# Patient Record
Sex: Female | Born: 1960
Health system: Southern US, Community
[De-identification: ages and names within clinical notes are randomized; demographics above are authoritative.]

## PROBLEM LIST (undated history)

## (undated) DIAGNOSIS — I10 Essential (primary) hypertension: Secondary | ICD-10-CM

## (undated) DIAGNOSIS — D241 Benign neoplasm of right breast: Secondary | ICD-10-CM

## (undated) HISTORY — PX: BREAST BIOPSY: SHX20

## (undated) HISTORY — DX: Benign neoplasm of right breast: D24.1

## (undated) HISTORY — DX: Essential (primary) hypertension: I10

---
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
## 1985-01-25 HISTORY — PX: LEG SURGERY: SHX1003

## 2005-01-25 HISTORY — PX: SMALL INTESTINE SURGERY: SHX150

## 2015-07-03 ENCOUNTER — Encounter: Payer: Self-pay | Admitting: Osteopathic Medicine

## 2015-07-03 ENCOUNTER — Ambulatory Visit (INDEPENDENT_AMBULATORY_CARE_PROVIDER_SITE_OTHER): Payer: BLUE CROSS/BLUE SHIELD | Admitting: Osteopathic Medicine

## 2015-07-03 VITALS — BP 137/87 | HR 83 | Ht 70.0 in | Wt 173.0 lb

## 2015-07-03 DIAGNOSIS — Z780 Asymptomatic menopausal state: Secondary | ICD-10-CM

## 2015-07-03 DIAGNOSIS — I10 Essential (primary) hypertension: Secondary | ICD-10-CM | POA: Diagnosis not present

## 2015-07-03 DIAGNOSIS — Z720 Tobacco use: Secondary | ICD-10-CM

## 2015-07-03 LAB — COMPLETE METABOLIC PANEL WITH GFR
ALBUMIN: 4.4 g/dL (ref 3.6–5.1)
ALK PHOS: 90 U/L (ref 33–130)
ALT: 10 U/L (ref 6–29)
AST: 18 U/L (ref 10–35)
BILIRUBIN TOTAL: 0.4 mg/dL (ref 0.2–1.2)
BUN: 17 mg/dL (ref 7–25)
CALCIUM: 10.1 mg/dL (ref 8.6–10.4)
CO2: 25 mmol/L (ref 20–31)
CREATININE: 0.8 mg/dL (ref 0.50–1.05)
Chloride: 102 mmol/L (ref 98–110)
GFR, Est Non African American: 84 mL/min (ref >=60)
Glucose, Bld: 84 mg/dL (ref 65–99)
Potassium: 4.4 mmol/L (ref 3.5–5.3)
Sodium: 139 mmol/L (ref 135–146)
TOTAL PROTEIN: 7.3 g/dL (ref 6.1–8.1)

## 2015-07-03 LAB — LIPID PANEL
Cholesterol: 225 mg/dL — ABNORMAL HIGH (ref 125–200)
HDL: 73 mg/dL (ref >=46)
LDL CALC: 139 mg/dL — AB (ref <130)
TRIGLYCERIDES: 66 mg/dL (ref <150)
Total CHOL/HDL Ratio: 3.1 Ratio (ref <=5.0)
VLDL: 13 mg/dL (ref <30)

## 2015-07-03 LAB — CBC WITH DIFFERENTIAL/PLATELET
BASOS ABS: 0 {cells}/uL (ref 0–200)
BASOS PCT: 0 %
EOS ABS: 152 {cells}/uL (ref 15–500)
Eosinophils Relative: 2 %
HEMATOCRIT: 38 % (ref 35.0–45.0)
Hemoglobin: 12.3 g/dL (ref 11.7–15.5)
LYMPHS PCT: 40 %
Lymphs Abs: 3040 cells/uL (ref 850–3900)
MCH: 30 pg (ref 27.0–33.0)
MCHC: 32.4 g/dL (ref 32.0–36.0)
MCV: 92.7 fL (ref 80.0–100.0)
MONO ABS: 304 {cells}/uL (ref 200–950)
MPV: 9.9 fL (ref 7.5–12.5)
Monocytes Relative: 4 %
NEUTROS ABS: 4104 {cells}/uL (ref 1500–7800)
Neutrophils Relative %: 54 %
Platelets: 292 10*3/uL (ref 140–400)
RBC: 4.1 MIL/uL (ref 3.80–5.10)
RDW: 13.3 % (ref 11.0–15.0)
WBC: 7.6 10*3/uL (ref 3.8–10.8)

## 2015-07-03 LAB — TSH: TSH: 1.11 mIU/L

## 2015-07-03 MED ORDER — HYDROCHLOROTHIAZIDE 25 MG PO TABS: 25.0000 mg | ORAL_TABLET | Freq: Every day | ORAL | Status: DC

## 2015-07-03 MED ORDER — LOSARTAN POTASSIUM 50 MG PO TABS: 50.0000 mg | ORAL_TABLET | Freq: Every day | ORAL | Status: DC

## 2015-07-03 NOTE — Progress Notes (Signed)
HPI: Jenna Hess is a 55 y.o. female who presents to [facility] today for chief complaint of:  Chief Complaint  Patient presents with  . Establish Care    blood pressure    HTN . Context: new to establish care - needs refill on BP meds . Duration: Dx HTN x 4 years ago.  . Assoc signs/symptoms: no CP/SOB, no ha/vc  OTHER  Recent biometric screening at work 03/2015. Not sure we can get the results.   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 No pertinent past medical history. History reviewed. No pertinent past surgical history. Social History  Substance Use Topics  . Smoking status: Current Every Day Smoker  . Smokeless tobacco: Not on file  . Alcohol Use: Not on file   History reviewed. No pertinent family history.  Current Outpatient Prescriptions  Medication Sig Dispense Refill  . hydrochlorothiazide (HYDRODIURIL) 25 MG tablet Take 25 mg by mouth daily.    Marland Kitchen losartan (COZAAR) 50 MG tablet Take 50 mg by mouth daily.     No current facility-administered medications for this visit.   Allergies  Allergen Reactions  . Coconut Oil   . Codeine   . Morphine And Related       Review of Systems: CONSTITUTIONAL:  No  fever, no chills, No recent illness, No unintentional weight changes HEAD/EYES/EARS/NOSE/THROAT: No  headache, no vision change, no hearing change, No sore throat, No  sinus pressure CARDIAC: No  chest pain, No  pressure, No palpitations, No  orthopnea RESPIRATORY: No  cough, No  shortness of breath/wheeze GASTROINTESTINAL: No  nausea, No  vomiting, No  abdominal pain, No  blood in stool, No  diarrhea, No  constipation  MUSCULOSKELETAL: No  myalgia/arthralgia GENITOURINARY: No  incontinence, No  abnormal genital bleeding/discharge SKIN: No  rash/wounds/concerning lesions HEM/ONC: No  easy bruising/bleeding, No   abnormal lymph node ENDOCRINE: No polyuria/polydipsia/polyphagia, No  heat/cold intolerance  NEUROLOGIC: No  weakness, No  dizziness, No  slurred speech PSYCHIATRIC: No  concerns with depression, No  concerns with anxiety, No sleep problems  Exam:  BP 137/87 mmHg  Pulse 83  Ht 5\' 10"  (1.778 m)  Wt 173 lb (78.472 kg)  BMI 24.82 kg/m2 Constitutional: VS see above. General Appearance: alert, well-developed, well-nourished, NAD Eyes: Normal lids and conjunctive, non-icteric sclera, PERRLA Ears, Nose, Mouth, Throat: MMM, Normal external inspection ears/nares/mouth/lips/gums, TM normal bilaterally. Pharynx/tonsils no erythema, no exudate. Nasal mucosa normal.  Neck: No masses, trachea midline. No thyroid enlargement. No tenderness/mass appreciated. No lymphadenopathy Respiratory: Normal respiratory effort. no wheeze, no rhonchi, no rales Cardiovascular: S1/S2 normal, no murmur, no rub/gallop auscultated. RRR. No lower extremity edema. Gastrointestinal: Nontender, no masses. No hepatomegaly, no splenomegaly. No hernia appreciated. Bowel sounds normal. Rectal exam deferred. (+) scar tissue Musculoskeletal: Gait normal. No clubbing/cyanosis of digits.  Neurological: No cranial nerve deficit on limited exam. Motor and sensation intact and symmetric Skin: warm, dry, intact. No rash/ulcer. No concerning nevi or subq nodules on limited exam.   Psychiatric: Normal judgment/insight. Normal mood and affect. Oriented x3.    No results found for this or any previous visit (from the past 72 hour(s)).  No results found.   ASSESSMENT/PLAN:   Essential hypertension - Plan: hydrochlorothiazide (HYDRODIURIL) 25 MG tablet, losartan (COZAAR) 50 MG tablet, CBC with Differential/Platelet, COMPLETE METABOLIC PANEL WITH GFR, Lipid panel, TSH  Tobacco use  Postmenopausal -  Plan: VITAMIN D 25 Hydroxy (Vit-D Deficiency, Fractures)   All questions were answered. Visit summary with medication list and pertinent  instructions was printed for patient to review. ER/RTC precautions were reviewed with the patient. Return in about 6 months (around 01/02/2016) for [place] W/ PAP.

## 2015-07-04 LAB — VITAMIN D 25 HYDROXY (VIT D DEFICIENCY, FRACTURES): Vit D, 25-Hydroxy: 23 ng/mL — ABNORMAL LOW (ref 30–100)

## 2015-12-21 ENCOUNTER — Other Ambulatory Visit: Payer: Self-pay | Admitting: Osteopathic Medicine

## 2015-12-21 DIAGNOSIS — I10 Essential (primary) hypertension: Secondary | ICD-10-CM

## 2016-01-02 ENCOUNTER — Encounter: Payer: BLUE CROSS/BLUE SHIELD | Admitting: Osteopathic Medicine

## 2016-01-08 ENCOUNTER — Ambulatory Visit (INDEPENDENT_AMBULATORY_CARE_PROVIDER_SITE_OTHER): Payer: BLUE CROSS/BLUE SHIELD | Admitting: Osteopathic Medicine

## 2016-01-08 ENCOUNTER — Encounter: Payer: Self-pay | Admitting: Osteopathic Medicine

## 2016-01-08 VITALS — BP 135/87 | HR 92 | Ht 70.0 in | Wt 158.0 lb

## 2016-01-08 DIAGNOSIS — I10 Essential (primary) hypertension: Secondary | ICD-10-CM | POA: Diagnosis not present

## 2016-01-08 DIAGNOSIS — Z0000 Encounter for general adult medical examination without abnormal findings: Secondary | ICD-10-CM | POA: Diagnosis not present

## 2016-01-08 MED ORDER — LOSARTAN POTASSIUM 50 MG PO TABS: 50.0000 mg | ORAL_TABLET | Freq: Every day | ORAL | 11 refills | Status: DC

## 2016-01-08 MED ORDER — HYDROCHLOROTHIAZIDE 25 MG PO TABS: 25.0000 mg | ORAL_TABLET | Freq: Every day | ORAL | 11 refills | Status: DC

## 2016-01-08 NOTE — Progress Notes (Signed)
HPI: Jenna Hess is a 55 y.o. female  who presents to [facility] today, 01/08/16,  for chief complaint of:  Chief Complaint  Patient presents with  . Annual Exam   Patient here today for annual physical/blood pressure recheck. See below for review of preventive care. No complaints today.   Past medical, surgical, social and family history reviewed: Patient Active Problem List   Diagnosis Date Noted  . Essential hypertension 07/03/2015  . Tobacco use 07/03/2015  . Postmenopausal 07/03/2015   Past Surgical History:  Procedure Laterality Date  . APPENDECTOMY  1976  . LEG SURGERY  1987  . SMALL INTESTINE SURGERY  2007   small and large   Social History  Substance Use Topics  . Smoking status: Current Every Day Smoker  . Smokeless tobacco: Never Used  . Alcohol use Not on file   History reviewed. No pertinent family history.   Current medication list and allergy/intolerance information reviewed:   Current Outpatient Prescriptions  Medication Sig Dispense Refill  . hydrochlorothiazide (HYDRODIURIL) 25 MG tablet TAKE 1 TABLET (25 MG TOTAL) BY MOUTH DAILY. 90 tablet 1  . losartan (COZAAR) 50 MG tablet TAKE 1 TABLET (50 MG TOTAL) BY MOUTH DAILY. 90 tablet 1   No current facility-administered medications for this visit.    Allergies  Allergen Reactions  . Coconut Oil   . Codeine   . Morphine And Related       Review of Systems:  Constitutional:  No  fever, no chills, No recent illness   HEENT: No  headache, no vision change  Cardiac: No  chest pain, No  pressur  Respiratory:  No  shortness of breath. No  Cough  Gastrointestinal: No  abdominal pain, No  nausea, No  vomiting,  No  blood in stool, No  diarrhea, No  constipation   Musculoskeletal: No new myalgia/arthralgia  Genitourinary:No  abnormal genital bleeding, No abnormal genital discharge  Skin: No  Rash, No other wounds/concerning lesions  Neurologic: No  weakness,  No  dizziness  Psychiatric: No  concerns with depression, No  concerns with anxiety, No sleep problems, No mood problems  Exam:  BP 135/87   Pulse 92   Ht 5\' 10"  (1.778 m)   Wt 158 lb (71.7 kg)   BMI 22.67 kg/m   Constitutional: VS see above. General Appearance: alert, well-developed, well-nourished, NAD  Eyes: Normal lids and conjunctive, non-icteric sclera  Ears, Nose, Mouth, Throat: MMM, Normal external inspection ears/nares/mouth/lips/gums. TM normal bilaterally. Pharynx/tonsils no erythema, no exudate. Nasal mucosa normal.   Neck: No masses, trachea midline. No thyroid enlargement. No tenderness/mass appreciated. No lymphadenopathy  Respiratory: Normal respiratory effort. no wheeze, no rhonchi, no rales  Cardiovascular: S1/S2 normal, no murmur, no rub/gallop auscultated. RRR. No lower extremity edema.   Gastrointestinal: Nontender, no masses. No hepatomegaly, no splenomegaly. No hernia appreciated. Bowel sounds normal. Rectal exam deferred.   Musculoskeletal: Gait normal. No clubbing/cyanosis of digits.   Neurological: Normal balance/coordination. No tremor. No cranial nerve deficit on limited exam. Motor and sensation intact and symmetric. Cerebellar reflexes intact.   Skin: warm, dry, intact. No rash/ulcer. No concerning nevi or subq nodules on limited exam.    Psychiatric: Normal judgment/insight. Normal mood and affect. Oriented x3.      ASSESSMENT/PLAN:   Annual physical exam - Plan: Cologuard, MM DIGITAL SCREENING BILATERAL  Essential hypertension - Stable on Losartan, HCTZ - lipid and DM screening 07/03/15 - Plan: hydrochlorothiazide (HYDRODIURIL) 25 MG tablet, losartan (COZAAR)  50 MG tablet    FEMALE PREVENTIVE CARE Updated 01/08/16   ANNUAL SCREENING/COUNSELING  Diet/Exercise - HEALTHY HABITS DISCUSSED TO DECREASE CV RISK History  Smoking Status  . Current Every Day Smoker  Smokeless Tobacco  . Never Used   History  Alcohol use Not on file    Depression screen [facility] 2/9 01/08/2016  Decreased Interest 0  Down, Depressed, Hopeless 0  PHQ - 2 Score 0    Domestic violence concerns - no  HTN SCREENING - SEE [place]  Sexually active in the past year - Yes with female.  Need/want STI testing today? - no  Concerns about libido or pain with sex? - some, longstanding, not concerned  Plans for pregnancy? - menopause  INFECTIOUS DISEASE SCREENING  HIV - does not need  GC/CT - does not need  HepC - DOB [birth year] - needs - declined  TB - does not need  DISEASE SCREENING  Lipid - does not need - normal 6 mos ago  DM2 - does not need - normal 6 mos ago  Osteoporosis - women age 62+ - does not need  CANCER SCREENING  Cervical - needs - no history of abnormal, declined today  Breast - needs  Lung - does not need  Colon - needs - opts for cologuard  ADULT VACCINATION  Influenza - annual vaccine recommended  Td - booster every 10 years   Zoster - option at 64, yes at 60+   PCV13 - was not indicated  PPSV23 - was not indicated Immunization History  Administered Date(s) Administered  . Hepatitis B 01/07/2016   OTHER  Fall - exercise and Vit D age 84+ - does not need  Consider ASA - age 68-59 - does not need   Labs normal last time,, weight loss successful and BP good control, ok to repeat labs in 6-12 mos  Visit summary with medication list and pertinent instructions was printed for patient to review. All questions at time of visit were answered - patient instructed to contact office with any additional concerns. ER/RTC precautions were reviewed with the patient. Follow-up plan: Return in about 1 year (around 01/07/2017) for annual physical and pap, sooner if needed.

## 2016-02-03 ENCOUNTER — Ambulatory Visit (INDEPENDENT_AMBULATORY_CARE_PROVIDER_SITE_OTHER): Payer: BLUE CROSS/BLUE SHIELD | Admitting: Osteopathic Medicine

## 2016-02-03 ENCOUNTER — Ambulatory Visit (INDEPENDENT_AMBULATORY_CARE_PROVIDER_SITE_OTHER): Payer: BLUE CROSS/BLUE SHIELD

## 2016-02-03 ENCOUNTER — Encounter: Payer: Self-pay | Admitting: Osteopathic Medicine

## 2016-02-03 VITALS — BP 114/81 | HR 98 | Temp 98.2°F | Ht 70.0 in | Wt 157.0 lb

## 2016-02-03 DIAGNOSIS — J069 Acute upper respiratory infection, unspecified: Secondary | ICD-10-CM | POA: Diagnosis not present

## 2016-02-03 DIAGNOSIS — J029 Acute pharyngitis, unspecified: Secondary | ICD-10-CM

## 2016-02-03 DIAGNOSIS — B9789 Other viral agents as the cause of diseases classified elsewhere: Secondary | ICD-10-CM

## 2016-02-03 DIAGNOSIS — R928 Other abnormal and inconclusive findings on diagnostic imaging of breast: Secondary | ICD-10-CM

## 2016-02-03 DIAGNOSIS — B001 Herpesviral vesicular dermatitis: Secondary | ICD-10-CM | POA: Diagnosis not present

## 2016-02-03 DIAGNOSIS — Z1231 Encounter for screening mammogram for malignant neoplasm of breast: Secondary | ICD-10-CM | POA: Diagnosis not present

## 2016-02-03 LAB — POCT RAPID STREP A (OFFICE): RAPID STREP A SCREEN: NEGATIVE

## 2016-02-03 MED ORDER — LIDOCAINE VISCOUS HCL 2 % MT SOLN: 10.0000 mL | OROMUCOSAL | 0 refills | Status: DC | PRN

## 2016-02-03 MED ORDER — IPRATROPIUM BROMIDE 0.03 % NA SOLN
2.0000 | Freq: Three times a day (TID) | NASAL | 0 refills | Status: DC
Start: 2016-02-03 — End: 2016-10-25

## 2016-02-03 MED ORDER — BENZONATATE 200 MG PO CAPS: 200.0000 mg | ORAL_CAPSULE | Freq: Three times a day (TID) | ORAL | 0 refills | Status: DC | PRN

## 2016-02-03 MED ORDER — VALACYCLOVIR HCL 1 G PO TABS: 2000.0000 mg | ORAL_TABLET | Freq: Two times a day (BID) | ORAL | 2 refills | Status: DC

## 2016-02-03 NOTE — Patient Instructions (Signed)
Note: the following list assumes no pregnancy, normal liver & kidney function and no other drug interactions. Dr. Sheppard Coil has highlighted medications which are safe for you to use, but these may not be appropriate for everyone. Always ask a pharmacist or qualified medical provider if there are any questions!    Aches/Pains, Fever Acetaminophen (Tylenol) 500 mg tablets - take max 2 tablets (1000 mg) every 6 hours (4 times per day)  Ibuprofen (Motrin) 200 mg tablets - take max 4 tablets (800 mg) every 6 hours  Sinus Congestion Prescription Atrovent Cromolyn Nasal Spray (NasalCrom) 1 spray each nostril 3-4 times per day, max 6 imes per day Nasal Saline if desired Oxymetolazone (Afrin, others) sparing use due to rebound congestion - never use in young kids!  Phenylephrine (Sudafed) 10 mg tablets every 4 hours (or the 12-hour formulation) Diphenhydramine (Benadryl) 25 mg tablets - take max 2 tablets every 4 hours  Cough & Sore Throat Prescription cough pills or syrups Dextromethorphan (Robitussin, others) - cough suppressant Guaifenesin (Robitussin, Mucinex, others) - expectorant (helps cough up mucus) (Dextromethorphan and Guaifenesin also come in a combination tablet) Lozenges w/ Benzocaine + Menthol (Cepacol) Honey - as much as you want! Teas which "coat the throat" - look for ingredients Elm Bark, Licorice Root, Marshmallow Root  Other Zinc Lozenges within 24 hours of symptoms onset - mixed evidence this shortens the duration of the common cold Don't waste your money on Vitamin C or Echinacea

## 2016-02-03 NOTE — Progress Notes (Signed)
HPI: Jenna Hess is a 57 y.o. female who presents to [facility] 02/03/16 for chief complaint of:  Chief Complaint  Patient presents with  . Sore Throat    Acute Illness: . Quality:  Sick on and off x2 weeks, Initial flulike illness seemed to resolve but now is having some issues with significant sore throat, crusty eyes in the morning, dry cough for 3-4 days or so . Assoc signs/symptoms: see ROS - cold sore also . Duration: 3-4 days . Modifying factors: has tried the following OTC/Rx medications: none   Past medical, social and family history reviewed. Current medications and allergies reviewed.     Review of Systems:  Constitutional: No  fever/chills  HEENT: No  headache, Yes  sore throat, No  swollen glands  Cardiovascular: No chest pain  Respiratory:Yes  cough, No  shortness of breath  Gastrointestinal: No  nausea, No  vomiting,  No  diarrhea  Musculoskeletal:   No  myalgia/arthralgia  Skin/Integument:  Yes cold sore rash   Exam:  BP 114/81   Pulse 98   Temp 98.2 F (36.8 C) (Oral)   Ht 5\' 10"  (1.778 m)   Wt 157 lb (71.2 kg)   BMI 22.53 kg/m   Constitutional: VSS, see above. General Appearance: alert, well-developed, well-nourished, NAD  Eyes: Normal lids and conjunctive, non-icteric sclera, PERRLA  Ears, Nose, Mouth, Throat: Normal external inspection ears/nares/mouth/lips/gums, normal TM, MMM; posterior pharynx with erythema, without exudate, nasal mucosa normal  Neck: No masses, trachea midline. normal lymph nodes  Respiratory: Normal respiratory effort. No  wheeze/rhonchi/rales  Cardiovascular: S1/S2 normal, no murmur/rub/gallop auscultated. RRR.    ASSESSMENT/PLAN:  Viral URI with cough - Plan: ipratropium (ATROVENT) 0.03 % nasal spray, benzonatate (TESSALON) 200 MG capsule  Sore throat - Plan: POCT rapid strep A, Lidocaine HCl 2 % SOLN, DISCONTINUED: Lidocaine HCl 2 % SOLN, DISCONTINUED: Lidocaine HCl 2 %  SOLN  Viral pharyngitis - Plan: ipratropium (ATROVENT) 0.03 % nasal spray, Lidocaine HCl 2 % SOLN, DISCONTINUED: Lidocaine HCl 2 % SOLN, DISCONTINUED: Lidocaine HCl 2 % SOLN  Cold sore - Plan: valACYclovir (VALTREX) 1000 MG tablet     Patient Instructions  Note: the following list assumes no pregnancy, normal liver & kidney function and no other drug interactions. Dr. Sheppard Coil has highlighted medications which are safe for you to use, but these may not be appropriate for everyone. Always ask a pharmacist or qualified medical provider if there are any questions!    Aches/Pains, Fever Acetaminophen (Tylenol) 500 mg tablets - take max 2 tablets (1000 mg) every 6 hours (4 times per day)  Ibuprofen (Motrin) 200 mg tablets - take max 4 tablets (800 mg) every 6 hours  Sinus Congestion Prescription Atrovent Cromolyn Nasal Spray (NasalCrom) 1 spray each nostril 3-4 times per day, max 6 imes per day Nasal Saline if desired Oxymetolazone (Afrin, others) sparing use due to rebound congestion - never use in young kids!  Phenylephrine (Sudafed) 10 mg tablets every 4 hours (or the 12-hour formulation) Diphenhydramine (Benadryl) 25 mg tablets - take max 2 tablets every 4 hours  Cough & Sore Throat Prescription cough pills or syrups Dextromethorphan (Robitussin, others) - cough suppressant Guaifenesin (Robitussin, Mucinex, others) - expectorant (helps cough up mucus) (Dextromethorphan and Guaifenesin also come in a combination tablet) Lozenges w/ Benzocaine + Menthol (Cepacol) Honey - as much as you want! Teas which "coat the throat" - look for ingredients Elm Bark, Licorice Root, Marshmallow Root  Other Zinc Lozenges within  24 hours of symptoms onset - mixed evidence this shortens the duration of the common cold Don't waste your money on Vitamin C or Echinacea     Highlighted cough medications, dextromethorphan probably would be more helpful, can take with Tessalon, okay to use lozenges,  Honey, teas as noted above.    Visit summary was printed for the patient with medications and pertinent instructions for patient to review. ER/RTC precautions reviewed. All questions answered. Return if symptoms worsen or fail to improve.

## 2016-02-04 ENCOUNTER — Ambulatory Visit: Payer: BLUE CROSS/BLUE SHIELD

## 2016-02-04 ENCOUNTER — Other Ambulatory Visit: Payer: Self-pay | Admitting: Osteopathic Medicine

## 2016-02-04 DIAGNOSIS — R928 Other abnormal and inconclusive findings on diagnostic imaging of breast: Secondary | ICD-10-CM

## 2016-02-10 ENCOUNTER — Other Ambulatory Visit: Payer: Self-pay | Admitting: Osteopathic Medicine

## 2016-02-10 ENCOUNTER — Ambulatory Visit
Admission: RE | Admit: 2016-02-10 | Discharge: 2016-02-10 | Disposition: A | Discharge status: Home / Self Care | Payer: BLUE CROSS/BLUE SHIELD | Source: Ambulatory Visit | Attending: Osteopathic Medicine | Admitting: Osteopathic Medicine

## 2016-02-10 DIAGNOSIS — R928 Other abnormal and inconclusive findings on diagnostic imaging of breast: Secondary | ICD-10-CM

## 2016-02-10 DIAGNOSIS — R921 Mammographic calcification found on diagnostic imaging of breast: Secondary | ICD-10-CM | POA: Diagnosis not present

## 2016-02-10 DIAGNOSIS — N6489 Other specified disorders of breast: Secondary | ICD-10-CM | POA: Diagnosis not present

## 2016-02-13 ENCOUNTER — Inpatient Hospital Stay: Admission: RE | Admit: 2016-02-13 | Payer: BLUE CROSS/BLUE SHIELD | Source: Ambulatory Visit

## 2016-02-16 ENCOUNTER — Other Ambulatory Visit: Payer: Self-pay | Admitting: Osteopathic Medicine

## 2016-02-16 ENCOUNTER — Ambulatory Visit
Admission: RE | Admit: 2016-02-16 | Discharge: 2016-02-16 | Disposition: A | Discharge status: Home / Self Care | Payer: BLUE CROSS/BLUE SHIELD | Source: Ambulatory Visit | Attending: Osteopathic Medicine | Admitting: Osteopathic Medicine

## 2016-02-16 DIAGNOSIS — R928 Other abnormal and inconclusive findings on diagnostic imaging of breast: Secondary | ICD-10-CM

## 2016-02-16 DIAGNOSIS — I10 Essential (primary) hypertension: Secondary | ICD-10-CM

## 2016-02-16 DIAGNOSIS — D241 Benign neoplasm of right breast: Secondary | ICD-10-CM | POA: Diagnosis not present

## 2016-02-16 DIAGNOSIS — R921 Mammographic calcification found on diagnostic imaging of breast: Secondary | ICD-10-CM | POA: Diagnosis not present

## 2016-02-19 DIAGNOSIS — J0100 Acute maxillary sinusitis, unspecified: Secondary | ICD-10-CM | POA: Diagnosis not present

## 2016-02-19 DIAGNOSIS — J40 Bronchitis, not specified as acute or chronic: Secondary | ICD-10-CM | POA: Diagnosis not present

## 2016-03-21 ENCOUNTER — Encounter: Payer: Self-pay | Admitting: Osteopathic Medicine

## 2016-04-23 ENCOUNTER — Other Ambulatory Visit: Payer: Self-pay | Admitting: Osteopathic Medicine

## 2016-04-23 DIAGNOSIS — I10 Essential (primary) hypertension: Secondary | ICD-10-CM

## 2016-07-22 ENCOUNTER — Other Ambulatory Visit: Payer: Self-pay | Admitting: Osteopathic Medicine

## 2016-07-22 DIAGNOSIS — I10 Essential (primary) hypertension: Secondary | ICD-10-CM

## 2016-10-22 ENCOUNTER — Other Ambulatory Visit: Payer: Self-pay

## 2016-10-22 DIAGNOSIS — B001 Herpesviral vesicular dermatitis: Secondary | ICD-10-CM

## 2016-10-22 MED ORDER — VALACYCLOVIR HCL 1 G PO TABS: 2000.0000 mg | ORAL_TABLET | Freq: Two times a day (BID) | ORAL | 2 refills | Status: DC

## 2016-10-25 ENCOUNTER — Encounter: Payer: Self-pay | Admitting: Osteopathic Medicine

## 2016-10-25 ENCOUNTER — Other Ambulatory Visit (HOSPITAL_COMMUNITY)
Admission: RE | Admit: 2016-10-25 | Discharge: 2016-10-25 | Disposition: A | Discharge status: Home / Self Care | Payer: BLUE CROSS/BLUE SHIELD | Source: Ambulatory Visit | Attending: Osteopathic Medicine | Admitting: Osteopathic Medicine

## 2016-10-25 ENCOUNTER — Ambulatory Visit (INDEPENDENT_AMBULATORY_CARE_PROVIDER_SITE_OTHER): Payer: BLUE CROSS/BLUE SHIELD | Admitting: Osteopathic Medicine

## 2016-10-25 VITALS — BP 149/84 | HR 92 | Ht 69.5 in | Wt 172.0 lb

## 2016-10-25 DIAGNOSIS — Z780 Asymptomatic menopausal state: Secondary | ICD-10-CM | POA: Diagnosis not present

## 2016-10-25 DIAGNOSIS — Z720 Tobacco use: Secondary | ICD-10-CM

## 2016-10-25 DIAGNOSIS — Z0000 Encounter for general adult medical examination without abnormal findings: Secondary | ICD-10-CM | POA: Diagnosis not present

## 2016-10-25 DIAGNOSIS — Z1151 Encounter for screening for human papillomavirus (HPV): Secondary | ICD-10-CM | POA: Diagnosis not present

## 2016-10-25 DIAGNOSIS — Z23 Encounter for immunization: Secondary | ICD-10-CM | POA: Diagnosis not present

## 2016-10-25 DIAGNOSIS — R87610 Atypical squamous cells of undetermined significance on cytologic smear of cervix (ASC-US): Secondary | ICD-10-CM | POA: Insufficient documentation

## 2016-10-25 DIAGNOSIS — R87810 Cervical high risk human papillomavirus (HPV) DNA test positive: Secondary | ICD-10-CM | POA: Diagnosis not present

## 2016-10-25 DIAGNOSIS — I10 Essential (primary) hypertension: Secondary | ICD-10-CM

## 2016-10-25 DIAGNOSIS — B977 Papillomavirus as the cause of diseases classified elsewhere: Secondary | ICD-10-CM

## 2016-10-25 DIAGNOSIS — D241 Benign neoplasm of right breast: Secondary | ICD-10-CM

## 2016-10-25 DIAGNOSIS — Z0001 Encounter for general adult medical examination with abnormal findings: Secondary | ICD-10-CM | POA: Diagnosis not present

## 2016-10-25 HISTORY — DX: Benign neoplasm of right breast: D24.1

## 2016-10-25 NOTE — Patient Instructions (Signed)
Preventive Care 40-64 Years, Female Preventive care refers to lifestyle choices and visits with your health care provider that can promote health and wellness. What does preventive care include?  A yearly physical exam. This is also called an annual well check.  Dental exams once or twice a year.  Routine eye exams. Ask your health care provider how often you should have your eyes checked.  Personal lifestyle choices, including: ? Daily care of your teeth and gums. ? Regular physical activity. ? Eating a healthy diet. ? Avoiding tobacco and drug use. ? Limiting alcohol use. ? Practicing safe sex. ? Taking low-dose aspirin daily starting at age 58. ? Taking vitamin and mineral supplements as recommended by your health care provider. What happens during an annual well check? The services and screenings done by your health care provider during your annual well check will depend on your age, overall health, lifestyle risk factors, and family history of disease. Counseling Your health care provider may ask you questions about your:  Alcohol use.  Tobacco use.  Drug use.  Emotional well-being.  Home and relationship well-being.  Sexual activity.  Eating habits.  Work and work Statistician.  Method of birth control.  Menstrual cycle.  Pregnancy history.  Screening You may have the following tests or measurements:  Height, weight, and BMI.  Blood pressure.  Lipid and cholesterol levels. These may be checked every 5 years, or more frequently if you are over 81 years old.  Skin check.  Lung cancer screening. You may have this screening every year starting at age 78 if you have a 30-pack-year history of smoking and currently smoke or have quit within the past 15 years.  Fecal occult blood test (FOBT) of the stool. You may have this test every year starting at age 65.  Flexible sigmoidoscopy or colonoscopy. You may have a sigmoidoscopy every 5 years or a colonoscopy  every 10 years starting at age 30.  Hepatitis C blood test.  Hepatitis B blood test.  Sexually transmitted disease (STD) testing.  Diabetes screening. This is done by checking your blood sugar (glucose) after you have not eaten for a while (fasting). You may have this done every 1-3 years.  Mammogram. This may be done every 1-2 years. Talk to your health care provider about when you should start having regular mammograms. This may depend on whether you have a family history of breast cancer.  BRCA-related cancer screening. This may be done if you have a family history of breast, ovarian, tubal, or peritoneal cancers.  Pelvic exam and Pap test. This may be done every 3 years starting at age 80. Starting at age 36, this may be done every 5 years if you have a Pap test in combination with an HPV test.  Bone density scan. This is done to screen for osteoporosis. You may have this scan if you are at high risk for osteoporosis.  Discuss your test results, treatment options, and if necessary, the need for more tests with your health care provider. Vaccines Your health care provider may recommend certain vaccines, such as:  Influenza vaccine. This is recommended every year.  Tetanus, diphtheria, and acellular pertussis (Tdap, Td) vaccine. You may need a Td booster every 10 years.  Varicella vaccine. You may need this if you have not been vaccinated.  Zoster vaccine. You may need this after age 5.  Measles, mumps, and rubella (MMR) vaccine. You may need at least one dose of MMR if you were born in  1955 or later. You may also need a second dose.  Pneumococcal 13-valent conjugate (PCV13) vaccine. You may need this if you have certain conditions and were not previously vaccinated.  Pneumococcal polysaccharide (PPSV23) vaccine. You may need one or two doses if you smoke cigarettes or if you have certain conditions.  Meningococcal vaccine. You may need this if you have certain  conditions.  Hepatitis A vaccine. You may need this if you have certain conditions or if you travel or work in places where you may be exposed to hepatitis A.  Hepatitis B vaccine. You may need this if you have certain conditions or if you travel or work in places where you may be exposed to hepatitis B.  Haemophilus influenzae type b (Hib) vaccine. You may need this if you have certain conditions.  Talk to your health care provider about which screenings and vaccines you need and how often you need them. This information is not intended to replace advice given to you by your health care provider. Make sure you discuss any questions you have with your health care provider. Document Released: 02/07/2015 Document Revised: 10/01/2015 Document Reviewed: 11/12/2014 Elsevier Interactive Patient Education  2017 [employer].

## 2016-10-25 NOTE — Progress Notes (Signed)
HPI: Jenna Hess is a 56 y.o. female  who presents to [place] today, 10/25/16,  for chief complaint of:  Chief Complaint  Patient presents with  . Annual Exam   Patient here today for annual physical/blood pressure recheck. See below for review of preventive care. No complaints today.  HTN: took meds as usual in past 24 hours.   Would like to get mammo and colonoscopy in 01/2017, will put in orders now.    Past medical, surgical, social and family history reviewed: Patient Active Problem List   Diagnosis Date Noted  . Fibroadenoma of right breast 10/25/2016  . Cold sore 02/03/2016  . Essential hypertension 07/03/2015  . Tobacco use 07/03/2015  . Postmenopausal 07/03/2015   Past Surgical History:  Procedure Laterality Date  . APPENDECTOMY  1976  . LEG SURGERY  1987  . SMALL INTESTINE SURGERY  2007   small and large   Social History  Substance Use Topics  . Smoking status: Current Every Day Smoker  . Smokeless tobacco: Never Used  . Alcohol use Not on file   No family history on file.   Current medication list and allergy/intolerance information reviewed:   Current Outpatient Prescriptions  Medication Sig Dispense Refill  . hydrochlorothiazide (HYDRODIURIL) 25 MG tablet Take 1 tablet (25 mg total) by mouth daily. 90 tablet 1  . losartan (COZAAR) 50 MG tablet Take 1 tablet (50 mg total) by mouth daily. 30 tablet 11  . valACYclovir (VALTREX) 1000 MG tablet Take 2 tablets (2,000 mg total) by mouth 2 (two) times daily. For one day, as needed at first sign of cold sore outbreak 20 tablet 2   No current facility-administered medications for this visit.    Allergies  Allergen Reactions  . Coconut Oil   . Codeine   . Morphine And Related       Review of Systems:  Constitutional:  No  fever, no chills, No recent illness   HEENT: No  headache, no vision change  Cardiac: No  chest pain, No  pressur  Respiratory:  No   shortness of breath. No  Cough  Gastrointestinal: No  abdominal pain, No  nausea, No  vomiting,  No  blood in stool, No  diarrhea, No  constipation   Musculoskeletal: No new myalgia/arthralgia  Genitourinary:No  abnormal genital bleeding, No abnormal genital discharge  Skin: No  Rash, No other wounds/concerning lesions  Neurologic: No  weakness, No  dizziness  Psychiatric: No  concerns with depression, No  concerns with anxiety, No sleep problems, No mood problems  Exam:  BP (!) 149/84   Pulse 92   Ht 5' 9.5" (1.765 m)   Wt 172 lb (78 kg)   BMI 25.04 kg/m   Constitutional: VS see above. General Appearance: alert, well-developed, well-nourished, NAD  Eyes: Normal lids and conjunctive, non-icteric sclera  Ears, Nose, Mouth, Throat: MMM, Normal external inspection ears/nares/mouth/lips/gums. TM normal bilaterally. Pharynx/tonsils no erythema, no exudate. Nasal mucosa normal.   Neck: No masses, trachea midline. No thyroid enlargement. No tenderness/mass appreciated. No lymphadenopathy  Respiratory: Normal respiratory effort. no wheeze, no rhonchi, no rales  Cardiovascular: S1/S2 normal, no murmur, no rub/gallop auscultated. RRR. No lower extremity edema.   Gastrointestinal: Nontender, no masses. No hepatomegaly, no splenomegaly. No hernia appreciated. Bowel sounds normal. Rectal exam deferred.   Musculoskeletal: Gait normal. No clubbing/cyanosis of digits.   Neurological: Normal balance/coordination. No tremor. No cranial nerve deficit on limited exam. Motor and sensation intact and symmetric.  Cerebellar reflexes intact.   Skin: warm, dry, intact. No rash/ulcer. No concerning nevi or subq nodules on limited exam.    Psychiatric: Normal judgment/insight. Normal mood and affect. Oriented x3. GYN: No lesions/ulcers to external genitalia, normal urethra, normal postmenopausal slightly atrophic vaginal mucosa, physiologic discharge, cervix normal without lesions, uterus not enlarged  or tender, adnexa no masses and nontender  BREAST: No rashes/skin changes, normal fibrous breast tissue, no masses or tenderness, normal nipple without discharge, normal axilla       ASSESSMENT/PLAN:   Annual physical exam - Plan: Ambulatory referral to Gastroenterology, MM DIGITAL SCREENING BILATERAL, Cytology - PAP, CBC, COMPLETE METABOLIC PANEL WITH GFR, Lipid panel, TSH, HIV antibody, Hepatitis C antibody, VITAMIN D 25 Hydroxy (Vit-D Deficiency, Fractures)  Essential hypertension - Plan: CBC, COMPLETE METABOLIC PANEL WITH GFR, Lipid panel  Fibroadenoma of right breast - Plan: MM DIGITAL SCREENING BILATERAL  Tobacco use - 2 cigarettes per day, fewer than last year - progress encouraged!   Postmenopausal - Plan: VITAMIN D 25 Hydroxy (Vit-D Deficiency, Fractures)  Need for immunization against influenza - Plan: Flu Vaccine QUAD 36+ mos IM    FEMALE PREVENTIVE CARE Updated 10/25/16   ANNUAL SCREENING/COUNSELING  Diet/Exercise - HEALTHY HABITS DISCUSSED TO DECREASE CV RISK History  Smoking Status  . Current Every Day Smoker  Smokeless Tobacco  . Never Used   History  Alcohol use Not on file  very little/rare   Depression screen [facility] 2/9 10/25/2016  Decreased Interest 0  Down, Depressed, Hopeless 0  PHQ - 2 Score 0  Altered sleeping 0  Tired, decreased energy 0  Change in appetite 0  Feeling bad or failure about yourself  0  Trouble concentrating 0  Moving slowly or fidgety/restless 0  Suicidal thoughts 0  PHQ-9 Score 0    Domestic violence concerns - no  HTN SCREENING - SEE [place]  Sexually active in the past year - Yes with female.  Need/want STI testing today? - no  Concerns about libido or pain with sex? - some, longstanding, not concerned  Plans for pregnancy? - n/a due to menopause  INFECTIOUS DISEASE SCREENING  HIV - declined  GC/CT - does not need  HepC - declined  TB - does not need  DISEASE SCREENING  Lipid -  needs  DM2 - needs   Osteoporosis - women age 94+ - does not need yet  CANCER SCREENING  Cervical - needs - no history of abnormal  Breast - needs - fibroadenoma R Breast 01/2016  Lung - does not need  Colon - needs - will refer GI for colonoscopy   ADULT VACCINATION  Influenza - annual vaccine recommended  Td - booster every 10 years   Zoster - option at 50, yes at 60+   PCV13 - was not indicated  PPSV23 - was not indicated Immunization History  Administered Date(s) Administered  . Hepatitis B 01/07/2016  . Influenza,inj,Quad PF,6+ Mos 10/25/2016   OTHER   Fall - exercise and Vit D age 9+ - does not need  Consider ASA - age 28-59 - does not need     Visit summary with medication list and pertinent instructions was printed for patient to review. All questions at time of visit were answered - patient instructed to contact office with any additional concerns. ER/RTC precautions were reviewed with the patient. Follow-up plan: Return in about 4 months (around 02/25/2017) for recheck / keep an eye on BP .

## 2016-10-26 LAB — LIPID PANEL
Cholesterol: 231 mg/dL — ABNORMAL HIGH (ref <200)
HDL: 86 mg/dL (ref >50)
LDL Cholesterol (Calc): 121 mg/dL (calc) — ABNORMAL HIGH
Non-HDL Cholesterol (Calc): 145 mg/dL (calc) — ABNORMAL HIGH (ref <130)
Total CHOL/HDL Ratio: 2.7 (calc) (ref <5.0)
Triglycerides: 128 mg/dL (ref <150)

## 2016-10-26 LAB — CBC
HCT: 35.9 % (ref 35.0–45.0)
HEMOGLOBIN: 12 g/dL (ref 11.7–15.5)
MCH: 30.3 pg (ref 27.0–33.0)
MCHC: 33.4 g/dL (ref 32.0–36.0)
MCV: 90.7 fL (ref 80.0–100.0)
MPV: 9.8 fL (ref 7.5–12.5)
PLATELETS: 280 10*3/uL (ref 140–400)
RBC: 3.96 10*6/uL (ref 3.80–5.10)
RDW: 11.9 % (ref 11.0–15.0)
WBC: 6.9 10*3/uL (ref 3.8–10.8)

## 2016-10-26 LAB — COMPLETE METABOLIC PANEL WITH GFR
AG RATIO: 1.6 (calc) (ref 1.0–2.5)
ALBUMIN MSPROF: 4.2 g/dL (ref 3.6–5.1)
ALT: 13 U/L (ref 6–29)
AST: 20 U/L (ref 10–35)
Alkaline phosphatase (APISO): 91 U/L (ref 33–130)
BILIRUBIN TOTAL: 0.6 mg/dL (ref 0.2–1.2)
BUN: 15 mg/dL (ref 7–25)
CO2: 30 mmol/L (ref 20–32)
Calcium: 9.7 mg/dL (ref 8.6–10.4)
Chloride: 103 mmol/L (ref 98–110)
Creat: 0.88 mg/dL (ref 0.50–1.05)
GFR, EST AFRICAN AMERICAN: 86 mL/min/{1.73_m2} (ref >=60)
GFR, Est Non African American: 74 mL/min/{1.73_m2} (ref >=60)
Globulin: 2.7 g/dL (calc) (ref 1.9–3.7)
Glucose, Bld: 95 mg/dL (ref 65–99)
POTASSIUM: 3.7 mmol/L (ref 3.5–5.3)
SODIUM: 141 mmol/L (ref 135–146)
TOTAL PROTEIN: 6.9 g/dL (ref 6.1–8.1)

## 2016-10-26 LAB — HIV ANTIBODY (ROUTINE TESTING W REFLEX): HIV 1&2 Ab, 4th Generation: NONREACTIVE

## 2016-10-26 LAB — HEPATITIS C ANTIBODY
Hepatitis C Ab: NONREACTIVE
SIGNAL TO CUT-OFF: 0.01 (ref <1.00)

## 2016-10-26 LAB — VITAMIN D 25 HYDROXY (VIT D DEFICIENCY, FRACTURES): Vit D, 25-Hydroxy: 25 ng/mL — ABNORMAL LOW (ref 30–100)

## 2016-10-26 LAB — TSH: TSH: 1.25 m[IU]/L

## 2016-11-03 LAB — CYTOLOGY - PAP
Diagnosis: UNDETERMINED — AB
HPV 16/18/45 GENOTYPING: NEGATIVE
HPV: DETECTED — AB

## 2016-11-05 DIAGNOSIS — B977 Papillomavirus as the cause of diseases classified elsewhere: Secondary | ICD-10-CM | POA: Insufficient documentation

## 2016-11-05 DIAGNOSIS — R87610 Atypical squamous cells of undetermined significance on cytologic smear of cervix (ASC-US): Secondary | ICD-10-CM | POA: Insufficient documentation

## 2016-11-05 NOTE — Addendum Note (Signed)
Addended by: Maryla Morrow on: 11/05/2016 01:19 PM   Modules accepted: Orders

## 2016-11-15 ENCOUNTER — Ambulatory Visit (INDEPENDENT_AMBULATORY_CARE_PROVIDER_SITE_OTHER): Payer: BLUE CROSS/BLUE SHIELD | Admitting: Obstetrics and Gynecology

## 2016-11-15 ENCOUNTER — Encounter: Payer: Self-pay | Admitting: Obstetrics and Gynecology

## 2016-11-15 VITALS — BP 144/83 | HR 86 | Resp 16 | Ht 69.5 in | Wt 167.0 lb

## 2016-11-15 DIAGNOSIS — N870 Mild cervical dysplasia: Secondary | ICD-10-CM

## 2016-11-15 DIAGNOSIS — R87610 Atypical squamous cells of undetermined significance on cytologic smear of cervix (ASC-US): Secondary | ICD-10-CM

## 2016-11-15 NOTE — Progress Notes (Signed)
56 yo with ASCUS + HPV on 10/2016 pap smear here for colposcopy Patient given informed consent, signed copy in the chart, time out was performed.  Placed in lithotomy position. Cervix viewed with speculum and colposcope after application of acetic acid.   Colposcopy adequate?  yes Acetowhite lesions? no Punctation? no Mosaicism?  no Abnormal vasculature?  no Biopsies? no ECC? yes  COMMENTS:  Patient was given post procedure instructions.  She will return in 2 weeks for results.  Mora Bellman, MD

## 2016-11-19 ENCOUNTER — Telehealth: Payer: Self-pay | Admitting: *Deleted

## 2016-11-19 NOTE — Telephone Encounter (Signed)
Pt notified of ECC results and she is to make sure that she does get a repeat pap in 1 year.  Encourage patient to start a B Complex vitamin to help with her immune system.

## 2016-11-19 NOTE — Telephone Encounter (Signed)
-----   Message from Mora Bellman, MD sent at 11/19/2016 11:28 AM EDT ----- Please inform patient of ECC results consistent with pap smear. Plan is to repeat pap smear next year. She should take vitamin B supplements in the meantime to help her immune system  Thanks  Peggy

## 2017-01-06 ENCOUNTER — Encounter: Payer: BLUE CROSS/BLUE SHIELD | Admitting: Osteopathic Medicine

## 2017-01-10 ENCOUNTER — Other Ambulatory Visit: Payer: Self-pay | Admitting: Osteopathic Medicine

## 2017-01-10 DIAGNOSIS — I10 Essential (primary) hypertension: Secondary | ICD-10-CM

## 2017-01-23 ENCOUNTER — Other Ambulatory Visit: Payer: Self-pay | Admitting: Osteopathic Medicine

## 2017-01-23 DIAGNOSIS — I10 Essential (primary) hypertension: Secondary | ICD-10-CM

## 2017-02-25 ENCOUNTER — Encounter: Payer: Self-pay | Admitting: Osteopathic Medicine

## 2017-02-25 ENCOUNTER — Ambulatory Visit (INDEPENDENT_AMBULATORY_CARE_PROVIDER_SITE_OTHER): Payer: BLUE CROSS/BLUE SHIELD | Admitting: Osteopathic Medicine

## 2017-02-25 VITALS — BP 136/72 | HR 84 | Temp 98.1°F | Wt 172.1 lb

## 2017-02-25 DIAGNOSIS — I10 Essential (primary) hypertension: Secondary | ICD-10-CM

## 2017-02-25 MED ORDER — LOSARTAN POTASSIUM 50 MG PO TABS: 50.0000 mg | ORAL_TABLET | Freq: Every day | ORAL | 3 refills | Status: DC

## 2017-02-25 MED ORDER — HYDROCHLOROTHIAZIDE 25 MG PO TABS: 25.0000 mg | ORAL_TABLET | Freq: Every day | ORAL | 3 refills | Status: DC

## 2017-02-25 NOTE — Patient Instructions (Signed)
[facility] [phone] - call for colonoscopy

## 2017-02-25 NOTE — Progress Notes (Signed)
HPI: Jenna Hess is a 57 y.o. female who  has a past medical history of Fibroadenoma of right breast (10/25/2016) and Hypertension.  she presents to [facility] today, 02/25/17,  for chief complaint of: Blood pressure follow-up   BP at annual 144/83 and prior to that 149/84. Today 136/72 - she closed on her house and this has relieved a lot of stress.   Doing well otherwise, no complaints!   No dizziness, cheat pain, headaches, or SOB.    Past medical, surgical, social and family history reviewed:  Patient Active Problem List   Diagnosis Date Noted  . Atypical squamous cells of undetermined significance (ASCUS) on Papanicolaou smear of cervix 11/05/2016  . HPV (human papilloma virus) infection 11/05/2016  . Fibroadenoma of right breast 10/25/2016  . Cold sore 02/03/2016  . Essential hypertension 07/03/2015  . Tobacco use 07/03/2015  . Postmenopausal 07/03/2015    Past Surgical History:  Procedure Laterality Date  . APPENDECTOMY  1976  . LEG SURGERY  1987  . SMALL INTESTINE SURGERY  2007   small and large    Social History   Tobacco Use  . Smoking status: Current Some Day Smoker  . Smokeless tobacco: Never Used  . Tobacco comment: As per pt, smokes 3-4 cigs   Substance Use Topics  . Alcohol use: No    Alcohol/week: 0.0 oz    Family History  Problem Relation Age of Onset  . Heart disease Mother      Current medication list and allergy/intolerance information reviewed:    Current Outpatient Medications  Medication Sig Dispense Refill  . cholecalciferol (VITAMIN D) 1000 units tablet Take 1,000 Units by mouth daily.    . hydrochlorothiazide (HYDRODIURIL) 25 MG tablet TAKE 1 TABLET BY MOUTH EVERY DAY 90 tablet 1  . losartan (COZAAR) 50 MG tablet TAKE 1 TABLET (50 MG TOTAL) BY MOUTH DAILY. 90 tablet 1  . valACYclovir (VALTREX) 1000 MG tablet Take 2 tablets (2,000 mg total) by mouth 2 (two) times daily. For one day, as needed at  first sign of cold sore outbreak 20 tablet 2   No current facility-administered medications for this visit.     Allergies  Allergen Reactions  . Bee Venom Anaphylaxis  . Coconut Oil   . Codeine   . Morphine And Related       Review of Systems:  Constitutional:  No  fever, no chills, No recent illness, No unintentional weight changes. No significant fatigue.   HEENT: No  headache, no vision change  Cardiac: No  chest pain, No  pressure, No palpitations, No  Orthopnea  Respiratory:  No  shortness of breath. No  Cough  Neurologic: No  weakness, No  dizziness  Exam:  BP 136/72   Pulse 84   Temp 98.1 F (36.7 C) (Oral)   Wt 172 lb 1.9 oz (78.1 kg)   BMI 25.05 kg/m   Constitutional: VS see above. General Appearance: alert, well-developed, well-nourished, NAD  Neck: No masses, trachea midline  Respiratory: Normal respiratory effort. no wheeze, no rhonchi, no rales  Cardiovascular: S1/S2 normal, no murmur, no rub/gallop auscultated. RRR.   Musculoskeletal: Gait normal.   Neurological: Normal balance/coordination. No tremor. Marland Kitchen    Psychiatric: Normal judgment/insight. Normal mood and affect. Oriented x3.     ASSESSMENT/PLAN:   Essential hypertension - Plan: losartan (COZAAR) 50 MG tablet, hydrochlorothiazide (HYDRODIURIL) 25 MG tablet  Patient [place] [phone] - call for colonoscopy  Visit summary with medication list and pertinent instructions was printed for patient to review. All questions at time of visit were answered - patient instructed to contact office with any additional concerns. ER/RTC precautions were reviewed with the patient.   Follow-up plan: Return for annual physical when due in 10/2017, soner if needed .  Note: Total time spent 15 minutes, greater than 50% of the visit was spent face-to-face counseling and coordinating care for the following: The encounter diagnosis was Essential hypertension..  Please note:  voice recognition software was used to produce this document, and typos may escape review. Please contact Dr. Sheppard Coil for any needed clarifications.

## 2017-09-23 DIAGNOSIS — D122 Benign neoplasm of ascending colon: Secondary | ICD-10-CM | POA: Diagnosis not present

## 2017-09-23 DIAGNOSIS — Z1211 Encounter for screening for malignant neoplasm of colon: Secondary | ICD-10-CM | POA: Diagnosis not present

## 2017-09-23 LAB — HM COLONOSCOPY

## 2017-10-25 ENCOUNTER — Encounter: Payer: BLUE CROSS/BLUE SHIELD | Admitting: Osteopathic Medicine

## 2017-11-14 ENCOUNTER — Ambulatory Visit (INDEPENDENT_AMBULATORY_CARE_PROVIDER_SITE_OTHER): Payer: BLUE CROSS/BLUE SHIELD | Admitting: Osteopathic Medicine

## 2017-11-14 ENCOUNTER — Encounter: Payer: Self-pay | Admitting: Osteopathic Medicine

## 2017-11-14 VITALS — BP 130/84 | HR 71 | Temp 98.1°F | Wt 185.3 lb

## 2017-11-14 DIAGNOSIS — I10 Essential (primary) hypertension: Secondary | ICD-10-CM | POA: Diagnosis not present

## 2017-11-14 DIAGNOSIS — R87610 Atypical squamous cells of undetermined significance on cytologic smear of cervix (ASC-US): Secondary | ICD-10-CM | POA: Diagnosis not present

## 2017-11-14 DIAGNOSIS — Z0000 Encounter for general adult medical examination without abnormal findings: Secondary | ICD-10-CM

## 2017-11-14 DIAGNOSIS — D241 Benign neoplasm of right breast: Secondary | ICD-10-CM

## 2017-11-14 DIAGNOSIS — Z23 Encounter for immunization: Secondary | ICD-10-CM | POA: Diagnosis not present

## 2017-11-14 DIAGNOSIS — E559 Vitamin D deficiency, unspecified: Secondary | ICD-10-CM

## 2017-11-14 DIAGNOSIS — B001 Herpesviral vesicular dermatitis: Secondary | ICD-10-CM

## 2017-11-14 DIAGNOSIS — Z1239 Encounter for other screening for malignant neoplasm of breast: Secondary | ICD-10-CM

## 2017-11-14 MED ORDER — HYDROCHLOROTHIAZIDE 25 MG PO TABS
25.0000 mg | ORAL_TABLET | Freq: Every day | ORAL | 3 refills | Status: DC
Start: 2017-11-14 — End: 2018-11-15

## 2017-11-14 MED ORDER — LOSARTAN POTASSIUM 50 MG PO TABS: 50.0000 mg | ORAL_TABLET | Freq: Every day | ORAL | 3 refills | Status: DC

## 2017-11-14 MED ORDER — VALACYCLOVIR HCL 1 G PO TABS: 2000.0000 mg | ORAL_TABLET | Freq: Two times a day (BID) | ORAL | 2 refills | Status: DC

## 2017-11-14 NOTE — Progress Notes (Signed)
HPI: Jenna Hess is a 57 y.o. female who  has a past medical history of Fibroadenoma of right breast (10/25/2016) and Hypertension.  she presents to [facility] today, 11/14/17,  for chief complaint of: Annual physical    Patient here for annual physical / wellness exam.  See preventive care reviewed as below.  Recent labs reviewed in detail with the patient.   Additional concerns today include:  Needs form filled out for work/insurance.  Otherwise, doing well and no concerns today   Past medical, surgical, social and family history reviewed:  Patient Active Problem List   Diagnosis Date Noted  . Atypical squamous cells of undetermined significance (ASCUS) on Papanicolaou smear of cervix 11/05/2016  . HPV (human papilloma virus) infection 11/05/2016  . Fibroadenoma of right breast 10/25/2016  . Cold sore 02/03/2016  . Essential hypertension 07/03/2015  . Tobacco use 07/03/2015  . Postmenopausal 07/03/2015    Past Surgical History:  Procedure Laterality Date  . APPENDECTOMY  1976  . LEG SURGERY  1987  . SMALL INTESTINE SURGERY  2007   small and large    Social History   Tobacco Use  . Smoking status: Current Some Day Smoker  . Smokeless tobacco: Never Used  . Tobacco comment: As per pt, smokes 3-4 cigs   Substance Use Topics  . Alcohol use: No    Alcohol/week: 0.0 standard drinks    Family History  Problem Relation Age of Onset  . Heart disease Mother      Current medication list and allergy/intolerance information reviewed:    Current Outpatient Medications  Medication Sig Dispense Refill  . cholecalciferol (VITAMIN D) 1000 units tablet Take 1,000 Units by mouth daily.    . hydrochlorothiazide (HYDRODIURIL) 25 MG tablet Take 1 tablet (25 mg total) by mouth daily. 90 tablet 3  . losartan (COZAAR) 50 MG tablet Take 1 tablet (50 mg total) by mouth daily. 90 tablet 3  . valACYclovir (VALTREX) 1000 MG tablet Take 2 tablets  (2,000 mg total) by mouth 2 (two) times daily. For one day, as needed at first sign of cold sore outbreak 20 tablet 2   No current facility-administered medications for this visit.     Allergies  Allergen Reactions  . Bee Venom Anaphylaxis  . Coconut Oil   . Codeine   . Morphine And Related       Review of Systems:  Constitutional:  No  fever, no chills, No recent illness, No unintentional weight changes. No significant fatigue.   HEENT: No  headache, no vision change, no hearing change, No sore throat, No  sinus pressure  Cardiac: No  chest pain, No  pressure, No palpitations, No  Orthopnea  Respiratory:  No  shortness of breath. No  Cough  Gastrointestinal: No  abdominal pain, No  nausea, No  vomiting,  No  blood in stool, No  diarrhea, No  constipation   Musculoskeletal: No new myalgia/arthralgia  Skin: No  Rash, No other wounds/concerning lesions  Genitourinary: No  incontinence, No  abnormal genital bleeding, No abnormal genital discharge  Hem/Onc: No  easy bruising/bleeding, No  abnormal lymph node  Endocrine: No cold intolerance,  No heat intolerance. No polyuria/polydipsia/polyphagia   Neurologic: No  weakness, No  dizziness, No  slurred speech/focal weakness/facial droop  Psychiatric: No  concerns with depression, No  concerns with anxiety, No sleep problems, No mood problems  Exam:  BP 130/84 (BP Location: Left Arm, Patient Position: Sitting, Cuff Size: Large)  Pulse 71   Temp 98.1 F (36.7 C) (Oral)   Wt 185 lb 4.8 oz (84.1 kg)   BMI 26.97 kg/m   Constitutional: VS see above. General Appearance: alert, well-developed, well-nourished, NAD  Eyes: Normal lids and conjunctive, non-icteric sclera  Ears, Nose, Mouth, Throat: MMM, Normal external inspection ears/nares/mouth/lips/gums. TM normal bilaterally. Pharynx/tonsils no erythema, no exudate. Nasal mucosa normal.   Neck: No masses, trachea midline. No thyroid enlargement. No tenderness/mass  appreciated. No lymphadenopathy  Respiratory: Normal respiratory effort. no wheeze, no rhonchi, no rales  Cardiovascular: S1/S2 normal, no murmur, no rub/gallop auscultated. RRR. No lower extremity edema.   Gastrointestinal: Nontender, no masses. No hepatomegaly, no splenomegaly. No hernia appreciated. Bowel sounds normal. Rectal exam deferred.   Musculoskeletal: Gait normal. No clubbing/cyanosis of digits.   Neurological: Normal balance/coordination. No tremor. No cranial nerve deficit on limited exam. Motor and sensation intact and symmetric. Cerebellar reflexes intact.   Skin: warm, dry, intact. No rash/ulcer. No concerning nevi or subq nodules on limited exam.    Psychiatric: Normal judgment/insight. Normal mood and affect. Oriented x3.      ASSESSMENT/PLAN:   Annual physical exam  Need for Tdap vaccination - Plan: Tdap vaccine greater than or equal to 7yo IM  Essential hypertension - Plan: hydrochlorothiazide (HYDRODIURIL) 25 MG tablet, losartan (COZAAR) 50 MG tablet  Atypical squamous cells of undetermined significance on cytologic smear of cervix (ASC-US)  Cold sore - Plan: valACYclovir (VALTREX) 1000 MG tablet  Essential hypertension - Stable on Losartan, HCTZ  - Plan: hydrochlorothiazide (HYDRODIURIL) 25 MG tablet, losartan (COZAAR) 50 MG tablet  Fibroadenoma of right breast - Plan: MM 3D SCREEN BREAST BILATERAL  Breast cancer screening - Plan: MM 3D SCREEN BREAST BILATERAL    Patient Instructions  General Preventive Care  Most recent routine screening lipids/other labs: ordered today. Cholesterol and Diabetes screening usually recommended annually.   Everyone should have blood pressure checked once per year.   Tobacco: don't! Alcohol: responsible moderation is ok for most adults - if you have concerns about your alcohol intake, please talk to me! Recreational/Illicit Drugs: don't!  Exercise: as tolerated to reduce risk of cardiovascular disease and diabetes.  Strength training will also prevent osteoporosis.   Mental health: if need for mental health care (medicines, counseling, other), or concerns about moods, please let me know!   Sexual health: if need for STD testing, or if concerns with libido/pain problems, please let me know! If you need to discuss your birth control options, please let me know!  Vaccines  Flu vaccine: recommended for almost everyone, every fall (by Halloween! Flu is scary!), especially if you are pregnant, if you have exposure to the public, if you're around young children or the elderly, or if you're around pregnant people.   Shingles vaccine: Shingrix recommended after age 14   Pneumonia vaccines: Prevnar and Pneumovax recommended after age 30, sooner if diabetes, COPD/asthma, others  Tetanus booster: Tdap recommended every 10 years Cancer screenings   Colon cancer screening: recommended for everyone at age 57, but some folks need a colonoscopy sooner if risk factors.  Reports normal colonoscopy 09/23/2017, to follow-up in 5 years.  Breast cancer screening: mammogram follow-up needed at breast center, patient needs to call them back.  I went ahead and put in an order just in case.  Cervical cancer screening: every 1 to 5 years depending on age and other risk factors. Can stop at age 4 or w/ hysterectomy as long as previous testing was normal.  Patient  is going to follow-up with OB/GYN down the hall for repeat Pap Infection screenings . HIV: recommended screening at least once age 79-65, more often if risk factors  . Gonorrhea/Chlamydia: screening as needed, though many insurances require testing for anyone on birth control pills . Hepatitis C: recommended once for anyone born 20-1963 . TB: certain at-risk populations, or depending on work requirements and/or travel history Other . Bone Density Test: recommended for women at age 1, men at age 53, sooner depending on risk factors . Advanced Directive: Living Will  and/or Healthcare Power of Attorney recommended for all adults, regardless of age or health!  Thyroid and Vitamin D: routine screening is not medically necessary, therefore most insurance will not cover this test as part of "free labs" on your annual physical. If you desire this testing, you may be charged for it! Please check with the lab before your blood draw if you're concerned about cost.    Immunization History  Administered Date(s) Administered  . Hepatitis B 01/07/2016  . Hepatitis B, adult 01/07/2016, 02/09/2016, 07/06/2016  . Influenza,inj,Quad PF,6+ Mos 10/25/2016, 11/14/2017  . Tdap 11/14/2017    Visit summary with medication list and pertinent instructions was printed for patient to review. All questions at time of visit were answered - patient instructed to contact office with any additional concerns. ER/RTC precautions were reviewed with the patient.   Follow-up plan: Return in about 6 months (around 05/16/2018) for BP follow-up, sooner if needed.   Please note: voice recognition software was used to produce this document, and typos may escape review. Please contact Dr. Sheppard Coil for any needed clarifications.

## 2017-11-14 NOTE — Patient Instructions (Addendum)
General Preventive Care  Most recent routine screening lipids/other labs: ordered today. Cholesterol and Diabetes screening usually recommended annually.   Everyone should have blood pressure checked once per year.   Tobacco: don't! Alcohol: responsible moderation is ok for most adults - if you have concerns about your alcohol intake, please talk to me! Recreational/Illicit Drugs: don't!  Exercise: as tolerated to reduce risk of cardiovascular disease and diabetes. Strength training will also prevent osteoporosis.   Mental health: if need for mental health care (medicines, counseling, other), or concerns about moods, please let me know!   Sexual health: if need for STD testing, or if concerns with libido/pain problems, please let me know! If you need to discuss your birth control options, please let me know!  Vaccines  Flu vaccine: recommended for almost everyone, every fall (by Halloween! Flu is scary!), especially if you are pregnant, if you have exposure to the public, if you're around young children or the elderly, or if you're around pregnant people.   Shingles vaccine: Shingrix recommended after age 54   Pneumonia vaccines: Prevnar and Pneumovax recommended after age 92, sooner if diabetes, COPD/asthma, others  Tetanus booster: Tdap recommended every 10 years Cancer screenings   Colon cancer screening: recommended for everyone at age 49, but some folks need a colonoscopy sooner if risk factors   Breast cancer screening: mammogram recommended at age 32 every other year at least, and annually after age 77.   Cervical cancer screening: every 1 to 5 years depending on age and other risk factors. Can stop at age 13 or w/ hysterectomy as long as previous testing was normal.  Infection screenings . HIV: recommended screening at least once age 43-65, more often if risk factors  . Gonorrhea/Chlamydia: screening as needed, though many insurances require testing for anyone on birth control  pills . Hepatitis C: recommended once for anyone born 68-1963 . TB: certain at-risk populations, or depending on work requirements and/or travel history Other . Bone Density Test: recommended for women at age 81, men at age 37, sooner depending on risk factors . Advanced Directive: Living Will and/or Healthcare Power of Attorney recommended for all adults, regardless of age or health!  Thyroid and Vitamin D: routine screening is not medically necessary, therefore most insurance will not cover this test as part of "free labs" on your annual physical. If you desire this testing, you may be charged for it! Please check with the lab before your blood draw if you're concerned about cost.

## 2017-11-14 NOTE — Addendum Note (Signed)
Addended by: Maryla Morrow on: 11/14/2017 02:38 PM   Modules accepted: Orders

## 2017-11-16 ENCOUNTER — Telehealth: Payer: Self-pay | Admitting: Osteopathic Medicine

## 2017-11-16 LAB — CBC
HCT: 33.8 % — ABNORMAL LOW (ref 35.0–45.0)
Hemoglobin: 11.3 g/dL — ABNORMAL LOW (ref 11.7–15.5)
MCH: 30.1 pg (ref 27.0–33.0)
MCHC: 33.4 g/dL (ref 32.0–36.0)
MCV: 89.9 fL (ref 80.0–100.0)
MPV: 9.9 fL (ref 7.5–12.5)
PLATELETS: 257 10*3/uL (ref 140–400)
RBC: 3.76 10*6/uL — ABNORMAL LOW (ref 3.80–5.10)
RDW: 12.2 % (ref 11.0–15.0)
WBC: 5.7 10*3/uL (ref 3.8–10.8)

## 2017-11-16 LAB — LIPID PANEL
CHOL/HDL RATIO: 2.8 (calc) (ref <5.0)
CHOLESTEROL: 204 mg/dL — AB (ref <200)
HDL: 73 mg/dL (ref >50)
LDL CHOLESTEROL (CALC): 116 mg/dL — AB
NON-HDL CHOLESTEROL (CALC): 131 mg/dL — AB (ref <130)
Triglycerides: 49 mg/dL (ref <150)

## 2017-11-16 LAB — VITAMIN D 25 HYDROXY (VIT D DEFICIENCY, FRACTURES): Vit D, 25-Hydroxy: 26 ng/mL — ABNORMAL LOW (ref 30–100)

## 2017-11-16 LAB — COMPLETE METABOLIC PANEL WITH GFR
AG Ratio: 1.6 (calc) (ref 1.0–2.5)
ALBUMIN MSPROF: 4.2 g/dL (ref 3.6–5.1)
ALT: 11 U/L (ref 6–29)
AST: 17 U/L (ref 10–35)
Alkaline phosphatase (APISO): 77 U/L (ref 33–130)
BUN: 15 mg/dL (ref 7–25)
CALCIUM: 9.8 mg/dL (ref 8.6–10.4)
CO2: 29 mmol/L (ref 20–32)
CREATININE: 0.76 mg/dL (ref 0.50–1.05)
Chloride: 103 mmol/L (ref 98–110)
GFR, EST AFRICAN AMERICAN: 102 mL/min/{1.73_m2} (ref >=60)
GFR, EST NON AFRICAN AMERICAN: 88 mL/min/{1.73_m2} (ref >=60)
GLUCOSE: 83 mg/dL (ref 65–99)
Globulin: 2.6 g/dL (calc) (ref 1.9–3.7)
Potassium: 3.9 mmol/L (ref 3.5–5.3)
Sodium: 140 mmol/L (ref 135–146)
TOTAL PROTEIN: 6.8 g/dL (ref 6.1–8.1)
Total Bilirubin: 0.5 mg/dL (ref 0.2–1.2)

## 2017-11-16 LAB — TSH: TSH: 1.3 m[IU]/L

## 2017-11-16 NOTE — Telephone Encounter (Signed)
-----   Message from Emeterio Reeve, DO sent at 11/14/2017  2:01 PM EDT ----- Regarding: shingrix! Shingles list

## 2017-11-16 NOTE — Telephone Encounter (Signed)
Added

## 2017-11-17 ENCOUNTER — Encounter: Payer: Self-pay | Admitting: Osteopathic Medicine

## 2017-11-22 ENCOUNTER — Other Ambulatory Visit: Payer: Self-pay | Admitting: Osteopathic Medicine

## 2017-11-22 DIAGNOSIS — I10 Essential (primary) hypertension: Secondary | ICD-10-CM

## 2017-12-06 ENCOUNTER — Ambulatory Visit: Payer: BLUE CROSS/BLUE SHIELD

## 2018-05-01 ENCOUNTER — Telehealth: Payer: Self-pay

## 2018-05-01 NOTE — Telephone Encounter (Signed)
Pt returned call back to clinic. As per pt, she is requesting a rx for ringworms. She has been using OTC meds for 4 weeks and the affected area has not gotten better. If appropriate, pls send rx to [facility] on file.

## 2018-05-01 NOTE — Telephone Encounter (Signed)
I'd probably have her in the office so I can take a look at the rash. OTC medications typically work fine for ringworm, so if it's not helping then I'd like to see it. Plus, if we need to do oral medication she'll probably need blood work first - let's schedule her for in-office visit

## 2018-05-01 NOTE — Telephone Encounter (Signed)
Made an appointment for 05/03/2018, in office. No further questions at this time.

## 2018-05-01 NOTE — Telephone Encounter (Signed)
Jenna Hess called after hours [facility] stating she needed medication for a ringworm. I called and left a message for a return call.

## 2018-05-03 ENCOUNTER — Encounter: Payer: Self-pay | Admitting: Osteopathic Medicine

## 2018-05-03 ENCOUNTER — Ambulatory Visit (INDEPENDENT_AMBULATORY_CARE_PROVIDER_SITE_OTHER): Payer: BLUE CROSS/BLUE SHIELD | Admitting: Osteopathic Medicine

## 2018-05-03 ENCOUNTER — Other Ambulatory Visit: Payer: Self-pay

## 2018-05-03 VITALS — BP 130/63 | HR 88 | Temp 98.1°F | Wt 186.0 lb

## 2018-05-03 DIAGNOSIS — R21 Rash and other nonspecific skin eruption: Secondary | ICD-10-CM | POA: Diagnosis not present

## 2018-05-03 DIAGNOSIS — L817 Pigmented purpuric dermatosis: Secondary | ICD-10-CM | POA: Diagnosis not present

## 2018-05-03 MED ORDER — BETAMETHASONE DIPROPIONATE 0.05 % EX CREA: TOPICAL_CREAM | Freq: Two times a day (BID) | CUTANEOUS | 1 refills | Status: DC

## 2018-05-03 NOTE — Patient Instructions (Addendum)
Pathology results should be back within a week. Let's try topical steroids in the meantime, Rx sent to pharmacy. I suspect a possible inflammatory issue such as psoriasis/eczema or a vasculitis (blood vessel inflammation) as opposed to infection, though infection is still a possibility. If something changes or gets worse, please let me know!    Skin Biopsy, Care After This sheet gives you information about how to care for yourself after your procedure. Your health care provider may also give you more specific instructions. If you have problems or questions, contact your health care provider. What can I expect after the procedure? After the procedure, it is common to have:  Soreness.  Bruising.  Itching. Follow these instructions at home: Biopsy site care Follow instructions from your health care provider about how to take care of your biopsy site. Make sure you:  Wash your hands with soap and water before and after you change your bandage (dressing). If soap and water are not available, use hand sanitizer.  Apply ointment on your biopsy site as directed by your health care provider.  Change your dressing as told by your health care provider.  Leave stitches (sutures), skin glue, or adhesive strips in place. These skin closures may need to stay in place for 2 weeks or longer. If adhesive strip edges start to loosen and curl up, you may trim the loose edges. Do not remove adhesive strips completely unless your health care provider tells you to do that.  If the biopsy area bleeds, apply gentle pressure for 10 minutes. Check your biopsy site every day for signs of infection. Check for:  Redness, swelling, or pain.  Fluid or blood.  Warmth.  Pus or a bad smell.  General instructions  Rest and then return to your normal activities as told by your health care provider.  Take over-the-counter and prescription medicines only as told by your health care provider.  Keep all follow-up  visits as told by your health care provider. This is important. Contact a health care provider if:  You have redness, swelling, or pain around your biopsy site.  You have fluid or blood coming from your biopsy site.  Your biopsy site feels warm to the touch.  You have pus or a bad smell coming from your biopsy site.  You have a fever.  Your sutures, skin glue, or adhesive strips loosen or come off sooner than expected. Get help right away if:  You have bleeding that does not stop with pressure or a dressing. Summary  After the procedure, it is common to have soreness, bruising, and itching at the site.  Follow instructions from your health care provider about how to take care of your biopsy site.  Check your biopsy site every day for signs of infection.  Contact a health care provider if you have redness, swelling, or pain around your biopsy site, or your biopsy site feels warm to the touch.  Keep all follow-up visits as told by your health care provider. This is important. This information is not intended to replace advice given to you by your health care provider. Make sure you discuss any questions you have with your health care provider. Document Released: 02/07/2015 Document Revised: 07/11/2017 Document Reviewed: 07/11/2017 Elsevier Interactive Patient Education  [employer].

## 2018-05-03 NOTE — Progress Notes (Signed)
HPI: Jenna Hess is a 58 y.o. female who  has a past medical history of Fibroadenoma of right breast (10/25/2016) and Hypertension.  she presents to [facility] today, 05/03/18,  for chief complaint of:  Rash  . Context: called on-call nurse requesting Rx for ringworm, OTC meds x4 weeks not helpful.  . Location: several spots on L leg.  . Quality: reddish, tiny red spots in a patch, dry, not really itchy  . Duration: 1.5 mos or so . Assoc signs/symptoms: no fever/chills, no night sweats or joint pain   L leg:      At today's visit 05/03/18 ... PMH, PSH, FH reviewed and updated as needed.  Current medication list and allergy/intolerance hx reviewed and updated as needed. (See remainder of HPI, ROS, Phys Exam below)         ASSESSMENT/PLAN: The encounter diagnosis was Rash and nonspecific skin eruption.   Meds ordered this encounter  Medications  . betamethasone dipropionate (DIPROLENE) 0.05 % cream    Sig: Apply topically 2 (two) times daily. To affected area(s) as needed    Dispense:  45 g    Refill:  1    Patient Instructions  Pathology results should be back within a week. Let's try topical steroids in the meantime, Rx sent to pharmacy. I suspect a possible inflammatory issue such as psoriasis/eczema or a vasculitis (blood vessel inflammation) as opposed to infection, though infection is still a possibility. If something changes or gets worse, please let me know!    Skin Biopsy, Care After This sheet gives you information about how to care for yourself after your procedure. Your health care provider may also give you more specific instructions. If you have problems or questions, contact your health care provider. What can I expect after the procedure? After the procedure, it is common to have:  Soreness.  Bruising.  Itching. Follow these instructions at home: Biopsy site care Follow instructions from your health care  provider about how to take care of your biopsy site. Make sure you:  Wash your hands with soap and water before and after you change your bandage (dressing). If soap and water are not available, use hand sanitizer.  Apply ointment on your biopsy site as directed by your health care provider.  Change your dressing as told by your health care provider.  Leave stitches (sutures), skin glue, or adhesive strips in place. These skin closures may need to stay in place for 2 weeks or longer. If adhesive strip edges start to loosen and curl up, you may trim the loose edges. Do not remove adhesive strips completely unless your health care provider tells you to do that.  If the biopsy area bleeds, apply gentle pressure for 10 minutes. Check your biopsy site every day for signs of infection. Check for:  Redness, swelling, or pain.  Fluid or blood.  Warmth.  Pus or a bad smell.  General instructions  Rest and then return to your normal activities as told by your health care provider.  Take over-the-counter and prescription medicines only as told by your health care provider.  Keep all follow-up visits as told by your health care provider. This is important. Contact a health care provider if:  You have redness, swelling, or pain around your biopsy site.  You have fluid or blood coming from your biopsy site.  Your biopsy site feels warm to the touch.  You have pus or a bad smell coming from your biopsy  site.  You have a fever.  Your sutures, skin glue, or adhesive strips loosen or come off sooner than expected. Get help right away if:  You have bleeding that does not stop with pressure or a dressing. Summary  After the procedure, it is common to have soreness, bruising, and itching at the site.  Follow instructions from your health care provider about how to take care of your biopsy site.  Check your biopsy site every day for signs of infection.  Contact a health care provider if  you have redness, swelling, or pain around your biopsy site, or your biopsy site feels warm to the touch.  Keep all follow-up visits as told by your health care provider. This is important. This information is not intended to replace advice given to you by your health care provider. Make sure you discuss any questions you have with your health care provider. Document Released: 02/07/2015 Document Revised: 07/11/2017 Document Reviewed: 07/11/2017 Elsevier Interactive Patient Education  2019 [employer].      Follow-up plan: Return for suture removal 10-14 days.                                                 ################################################# ################################################# ################################################# #################################################    Current Meds  Medication Sig  . cholecalciferol (VITAMIN D) 1000 units tablet Take 1,000 Units by mouth daily.  . hydrochlorothiazide (HYDRODIURIL) 25 MG tablet Take 1 tablet (25 mg total) by mouth daily.  Marland Kitchen losartan (COZAAR) 50 MG tablet Take 1 tablet (50 mg total) by mouth daily.  . valACYclovir (VALTREX) 1000 MG tablet Take 2 tablets (2,000 mg total) by mouth 2 (two) times daily. For one day, as needed at first sign of cold sore outbreak    Allergies  Allergen Reactions  . Bee Venom Anaphylaxis  . Coconut Oil   . Codeine   . Morphine And Related        Review of Systems:  Constitutional: No recent illness, no fever/chills  HEENT: No  headache, no vision change  Cardiac: No  chest pain, No  pressure, No palpitations  Respiratory:  No  shortness of breath. No  Cough  Gastrointestinal: No  abdominal pain, no change on bowel habits  Musculoskeletal: No new myalgia/arthralgia  Skin: +Rash  Hem/Onc: No  easy bruising/bleeding, No  abnormal lumps/bumps  Neurologic: No  weakness, No  Dizziness  Exam:  BP 130/63  (BP Location: Left Arm, Patient Position: Sitting, Cuff Size: Large)   Pulse 88   Temp 98.1 F (36.7 C) (Oral)   Wt 186 lb (84.4 kg)   BMI 27.07 kg/m   Constitutional: VS see above. General Appearance: alert, well-developed, well-nourished, NAD  Respiratory: Normal respiratory effort  Neurological: Normal balance/coordination. No tremor.  Skin: warm, dry, intact. See photos. Non blanching.   Psychiatric: Normal judgment/insight. Normal mood and affect. Oriented x3.    PRE-OP DIAGNOSIS: Abnormal Skin Lesion POST-OP DIAGNOSIS: Same  PROCEDURE: punch skin biopsy Performing Physician: Emeterio Reeve   PROCEDURE:  Punch (Size 4 mm)   The area surrounding the skin lesion was prepared and draped in the usual sterile manner. The lesion was removed in the usual manner by the biopsy method noted above. Hemostasis was assured. The patient tolerated the procedure well.   Closure:      suture simple interrupted x2 w/ 4.0 prolene  Followup: The patient tolerated the procedure well without complications.  Standard post-procedure care is explained and return precautions are given.     Visit summary with medication list and pertinent instructions was printed for patient to review, patient was advised to alert [place] if any updates are needed. All questions at time of visit were answered - patient instructed to contact office with any additional concerns. ER/RTC precautions were reviewed with the patient and understanding verbalized.     Please note: voice recognition software was used to produce this document, and typos may escape review. Please contact Dr. Sheppard Coil for any needed clarifications.    Follow up plan: Return for suture removal 10-14 days.

## 2018-05-16 ENCOUNTER — Other Ambulatory Visit: Payer: Self-pay | Admitting: Osteopathic Medicine

## 2018-05-16 DIAGNOSIS — B001 Herpesviral vesicular dermatitis: Secondary | ICD-10-CM

## 2018-05-16 DIAGNOSIS — I10 Essential (primary) hypertension: Secondary | ICD-10-CM

## 2018-05-16 NOTE — Telephone Encounter (Signed)
Routing to Dr. Redgie Grayer refill pool

## 2018-05-18 ENCOUNTER — Encounter: Payer: Self-pay | Admitting: Osteopathic Medicine

## 2018-05-18 ENCOUNTER — Ambulatory Visit (INDEPENDENT_AMBULATORY_CARE_PROVIDER_SITE_OTHER): Payer: BLUE CROSS/BLUE SHIELD | Admitting: Osteopathic Medicine

## 2018-05-18 VITALS — BP 137/67 | HR 80 | Temp 98.2°F | Wt 187.9 lb

## 2018-05-18 DIAGNOSIS — L817 Pigmented purpuric dermatosis: Secondary | ICD-10-CM

## 2018-05-18 NOTE — Progress Notes (Signed)
Skin cleaned w/ alcohol Sutures removed x2, patiet tolerated well No complaints Rash is improved w/ steroids!

## 2018-10-12 ENCOUNTER — Other Ambulatory Visit: Payer: Self-pay | Admitting: Osteopathic Medicine

## 2018-10-17 ENCOUNTER — Telehealth: Payer: Self-pay | Admitting: Osteopathic Medicine

## 2018-10-17 NOTE — Telephone Encounter (Signed)
Pt is welcomed to wait until provider comes back from vacation for an appt. There is an open slot on 04/15/18 at 320 pm, which is the closest time she is requesting. Pls contact pt and see if she is ok with that appt time/slot. Thanks.

## 2018-10-17 NOTE — Telephone Encounter (Signed)
Patient states that she needs a physical at 3:30pm due to work. States flu shot, shingles and a pneumonia. States that she needs this before November 1st per insurance. No slots available in that timeframe. She would like to wait till PCP comes back, did not want to see someone else before. Please advise.

## 2018-10-18 NOTE — Telephone Encounter (Signed)
Appointment has been made and patient was okay with that time. No further questions at this time.

## 2018-11-15 ENCOUNTER — Telehealth: Payer: Self-pay | Admitting: *Deleted

## 2018-11-15 ENCOUNTER — Other Ambulatory Visit: Payer: Self-pay

## 2018-11-15 ENCOUNTER — Ambulatory Visit (INDEPENDENT_AMBULATORY_CARE_PROVIDER_SITE_OTHER): Payer: BC Managed Care – PPO | Admitting: Osteopathic Medicine

## 2018-11-15 ENCOUNTER — Encounter: Payer: Self-pay | Admitting: Osteopathic Medicine

## 2018-11-15 VITALS — BP 110/79 | HR 85 | Temp 98.1°F | Wt 179.0 lb

## 2018-11-15 DIAGNOSIS — I10 Essential (primary) hypertension: Secondary | ICD-10-CM | POA: Diagnosis not present

## 2018-11-15 DIAGNOSIS — B001 Herpesviral vesicular dermatitis: Secondary | ICD-10-CM

## 2018-11-15 DIAGNOSIS — N870 Mild cervical dysplasia: Secondary | ICD-10-CM

## 2018-11-15 DIAGNOSIS — Z0000 Encounter for general adult medical examination without abnormal findings: Secondary | ICD-10-CM

## 2018-11-15 DIAGNOSIS — Z1231 Encounter for screening mammogram for malignant neoplasm of breast: Secondary | ICD-10-CM

## 2018-11-15 DIAGNOSIS — Z23 Encounter for immunization: Secondary | ICD-10-CM | POA: Diagnosis not present

## 2018-11-15 MED ORDER — VALACYCLOVIR HCL 1 G PO TABS: ORAL_TABLET | ORAL | 2 refills | Status: DC

## 2018-11-15 MED ORDER — HYDROCHLOROTHIAZIDE 25 MG PO TABS: 25.0000 mg | ORAL_TABLET | Freq: Every day | ORAL | 3 refills | Status: DC

## 2018-11-15 MED ORDER — BETAMETHASONE DIPROPIONATE 0.05 % EX CREA: TOPICAL_CREAM | Freq: Two times a day (BID) | CUTANEOUS | 1 refills | Status: DC

## 2018-11-15 MED ORDER — LOSARTAN POTASSIUM 50 MG PO TABS: 50.0000 mg | ORAL_TABLET | Freq: Every day | ORAL | 3 refills | Status: DC

## 2018-11-15 NOTE — Patient Instructions (Addendum)
General Preventive Care  Most recent routine screening lipids/other labs: ordered   Everyone should have blood pressure checked once per year.   Tobacco: don't!   Alcohol: responsible moderation is ok for most adults - if you have concerns about your alcohol intake, please talk to me!   Exercise: as tolerated to reduce risk of cardiovascular disease and diabetes. Strength training will also prevent osteoporosis.   Mental health: if need for mental health care (medicines, counseling, other), or concerns about moods, please let me know!   Sexual health: if need for STD testing, or if concerns with libido/pain problems, please let me know!   Advanced Directive: Living Will and/or Healthcare Power of Attorney recommended for all adults, regardless of age or health.  Vaccines  Flu vaccine: recommended for almost everyone, every fall.   Shingles vaccine: Shingrix recommended after age 30.  Pneumonia vaccines: Prevnar and Pneumovax recommended after age 14, or sooner if certain medical conditions/smokers   Tetanus booster: Tdap recommended every 10 years.  Cancer screenings   Colon cancer screening: recommended for everyone at age 16-75  Breast cancer screening: mammogram age 55-75  Cervical cancer screening: will follow-up with OBGYN!   Lung cancer screening: low risk - not needed  Infection screenings . HIV, Gonorrhea/Chlamydia: screening as needed . Hepatitis C: recommended for anyone born 23-1963 . TB: certain at-risk populations, or depending on work requirements and/or travel history Other . Bone Density Test: recommended for women at age 68

## 2018-11-15 NOTE — Telephone Encounter (Signed)
LM on voicemail cell phone that she was notified in 2018 to have a repeat pap smear in 2019 due to her ECC showing Cin 1 but that per her records she did not have one done.  Instructed to call the office to schedule a pap smear as a f/u from CIN 1 in 2018.

## 2018-11-15 NOTE — Progress Notes (Signed)
HPI: Jenna Hess is a 58 y.o. female who  has a past medical history of Fibroadenoma of right breast (10/25/2016) and Hypertension.  she presents to [facility] today, 11/15/18,  for chief complaint of: Annual physical     Patient here for annual physical / wellness exam.  See preventive care reviewed as below.    Additional concerns today include:  None, doing well   Past medical, surgical, social and family history reviewed:  Patient Active Problem List   Diagnosis Date Noted  . 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 Atypical squamous cells of undetermined significance (ASCUS) on Papanicolaou smear of cervix 11/05/2016  . HPV (human papilloma virus) infection 11/05/2016  . Fibroadenoma of right breast 10/25/2016  . Cold sore 02/03/2016  . Essential hypertension 07/03/2015  . Tobacco use 07/03/2015  . Postmenopausal 07/03/2015    Past Surgical History:  Procedure Laterality Date  . APPENDECTOMY  1976  . LEG SURGERY  1987  . SMALL INTESTINE SURGERY  2007   small and large    Social History   Tobacco Use  . Smoking status: Current Some Day Smoker  . Smokeless tobacco: Never Used  . Tobacco comment: As per pt, smokes 6-7 cigs   Substance Use Topics  . Alcohol use: No    Alcohol/week: 0.0 standard drinks    Family History  Problem Relation Age of Onset  . Heart disease Mother      Current medication list and allergy/intolerance information reviewed:    Current Outpatient Medications  Medication Sig Dispense Refill  . betamethasone dipropionate (DIPROLENE) 0.05 % cream Apply topically 2 (two) times daily. To affected area(s) as needed 45 g 1  . cholecalciferol (VITAMIN D) 1000 units tablet Take 1,000 Units by mouth daily.    . hydrochlorothiazide (HYDRODIURIL) 25 MG tablet Take 1 tablet (25 mg total) by mouth daily. 90 tablet 3  . losartan (COZAAR) 25 MG tablet TAKE 2 TABLETS BY MOUTH A DAY 180 tablet 0  . losartan  (COZAAR) 50 MG tablet TAKE 1 TABLET BY MOUTH EVERY DAY 90 tablet 1  . valACYclovir (VALTREX) 1000 MG tablet TAKE 2 TABLETS BY MOUTH 2 TIMES DAILY. FOR ONE DAY, AS NEEDED AT FIRST SIGN OF COLD SORE OUTBREAK 20 tablet 2   No current facility-administered medications for this visit.     Allergies  Allergen Reactions  . Bee Venom Anaphylaxis  . Coconut Oil   . Codeine   . Morphine And Related       Review of Systems:  Constitutional:  No  fever, no chills, No recent illness, No unintentional weight changes. No significant fatigue.   HEENT: No  headache, no vision change, no hearing change, No sore throat, No  sinus pressure  Cardiac: No  chest pain, No  pressure, No palpitations, No  Orthopnea  Respiratory:  No  shortness of breath. No  Cough  Gastrointestinal: No  abdominal pain, No  nausea, No  vomiting,  No  blood in stool, No  diarrhea, No  constipation   Musculoskeletal: No new myalgia/arthralgia  Skin: No  Rash, No other wounds/concerning lesions  Genitourinary: No  incontinence, No  abnormal genital bleeding, No abnormal genital discharge  Hem/Onc: No  easy bruising/bleeding, No  abnormal lymph node  Endocrine: No cold intolerance,  No heat intolerance. No polyuria/polydipsia/polyphagia   Neurologic: No  weakness, No  dizziness, No  slurred speech/focal weakness/facial droop  Psychiatric: No  concerns with depression, No  concerns with anxiety,  No sleep problems, No mood problems  Exam:  BP 110/79 (BP Location: Left Arm, Patient Position: Sitting, Cuff Size: Normal)   Pulse 85   Temp 98.1 F (36.7 C) (Oral)   Wt 179 lb 0.6 oz (81.2 kg)   BMI 26.06 kg/m   Constitutional: VS see above. General Appearance: alert, well-developed, well-nourished, NAD  Eyes: Normal lids and conjunctive, non-icteric sclera  Ears, Nose, Mouth, Throat: . TM normal bilaterally.   Neck: No masses, trachea midline. No thyroid enlargement. No tenderness/mass appreciated. No  lymphadenopathy  Respiratory: Normal respiratory effort. no wheeze, no rhonchi, no rales  Cardiovascular: S1/S2 normal, no murmur, no rub/gallop auscultated. RRR. No lower extremity edema.   Gastrointestinal: Nontender, no masses. No hepatomegaly, no splenomegaly. No hernia appreciated. Bowel sounds normal. Rectal exam deferred.   Musculoskeletal: Gait normal. No clubbing/cyanosis of digits.   Neurological: Normal balance/coordination. No tremor. No cranial nerve deficit on limited exam. Motor and sensation intact and symmetric. Cerebellar reflexes intact.   Skin: warm, dry, intact. No rash/ulcer. No concerning nevi or subq nodules on limited exam.    Psychiatric: Normal judgment/insight. Normal mood and affect. Oriented x3.    No results found for this or any previous visit (from the past 72 hour(s)).  No results found.     ASSESSMENT/PLAN: The primary encounter diagnosis was Annual physical exam. Diagnoses of Need for influenza vaccination, Need for varicella vaccine, Need for pneumococcal vaccination, Essential hypertension, CIN I (cervical intraepithelial neoplasia I), and Encounter for screening mammogram for malignant neoplasm of breast were also pertinent to this visit.   Messaged RN at [facility] to schedule f/u   Orders Placed This Encounter  Procedures  . MM 3D SCREEN BREAST BILATERAL  . Flu Vaccine QUAD 6+ mos PF IM (Fluarix Quad PF)  . Varicella-zoster vaccine IM (Shingrix)  . Pneumococcal polysaccharide vaccine 23-valent greater than or equal to 2yo subcutaneous/IM  . CBC  . COMPLETE METABOLIC PANEL WITH GFR  . Lipid panel       Patient Instructions  General Preventive Care  Most recent routine screening lipids/other labs: ordered   Everyone should have blood pressure checked once per year.   Tobacco: don't!   Alcohol: responsible moderation is ok for most adults - if you have concerns about your alcohol intake, please talk to me!   Exercise: as tolerated  to reduce risk of cardiovascular disease and diabetes. Strength training will also prevent osteoporosis.   Mental health: if need for mental health care (medicines, counseling, other), or concerns about moods, please let me know!   Sexual health: if need for STD testing, or if concerns with libido/pain problems, please let me know!   Advanced Directive: Living Will and/or Healthcare Power of Attorney recommended for all adults, regardless of age or health.  Vaccines  Flu vaccine: recommended for almost everyone, every fall.   Shingles vaccine: Shingrix recommended after age 74.  Pneumonia vaccines: Prevnar and Pneumovax recommended after age 67, or sooner if certain medical conditions/smokers   Tetanus booster: Tdap recommended every 10 years.  Cancer screenings   Colon cancer screening: recommended for everyone at age 23-75  Breast cancer screening: mammogram age 68-75  Cervical cancer screening: will follow-up with OBGYN!   Lung cancer screening: low risk - not needed  Infection screenings . HIV, Gonorrhea/Chlamydia: screening as needed . Hepatitis C: recommended for anyone born 70-1963 . TB: certain at-risk populations, or depending on work requirements and/or travel history Other . Bone Density Test: recommended for women at age  60        Visit summary with medication list and pertinent instructions was printed for patient to review. All questions at time of visit were answered - patient instructed to contact office with any additional concerns or updates. ER/RTC precautions were reviewed with the patient.    Please note: voice recognition software was used to produce this document, and typos may escape review. Please contact Dr. Sheppard Coil for any needed clarifications.     Follow-up plan: Return in about 1 year (around 11/15/2019) for [place] (call week prior to visit for lab orders).

## 2018-11-15 NOTE — Addendum Note (Signed)
Addended by: Maryla Morrow on: 11/15/2018 04:10 PM   Modules accepted: Orders

## 2018-11-15 NOTE — Telephone Encounter (Signed)
-----   Message from [place], DO sent at 11/15/2018  4:01 PM EDT ----- Regarding: pt needs follow-up Dr Elly Modena saw this lady a few years ago 2018 for colpo, looks like she might need follow-up for CIN1 on biopsy, can you look into this? Thanks!

## 2018-11-16 LAB — COMPLETE METABOLIC PANEL WITH GFR
AG Ratio: 1.6 (calc) (ref 1.0–2.5)
ALT: 8 U/L (ref 6–29)
AST: 15 U/L (ref 10–35)
Albumin: 4.2 g/dL (ref 3.6–5.1)
Alkaline phosphatase (APISO): 81 U/L (ref 37–153)
BUN: 19 mg/dL (ref 7–25)
CO2: 29 mmol/L (ref 20–32)
Calcium: 10.1 mg/dL (ref 8.6–10.4)
Chloride: 102 mmol/L (ref 98–110)
Creat: 0.87 mg/dL (ref 0.50–1.05)
GFR, Est African American: 86 mL/min/{1.73_m2} (ref >=60)
GFR, Est Non African American: 74 mL/min/{1.73_m2} (ref >=60)
Globulin: 2.7 g/dL (calc) (ref 1.9–3.7)
Glucose, Bld: 87 mg/dL (ref 65–99)
Potassium: 4.1 mmol/L (ref 3.5–5.3)
Sodium: 140 mmol/L (ref 135–146)
Total Bilirubin: 0.4 mg/dL (ref 0.2–1.2)
Total Protein: 6.9 g/dL (ref 6.1–8.1)

## 2018-11-16 LAB — LIPID PANEL
Cholesterol: 212 mg/dL — ABNORMAL HIGH (ref <200)
HDL: 64 mg/dL (ref >=50)
LDL Cholesterol (Calc): 131 mg/dL (calc) — ABNORMAL HIGH
Non-HDL Cholesterol (Calc): 148 mg/dL (calc) — ABNORMAL HIGH (ref <130)
Total CHOL/HDL Ratio: 3.3 (calc) (ref <5.0)
Triglycerides: 76 mg/dL (ref <150)

## 2018-11-16 LAB — CBC
HCT: 35.8 % (ref 35.0–45.0)
Hemoglobin: 11.8 g/dL (ref 11.7–15.5)
MCH: 30.3 pg (ref 27.0–33.0)
MCHC: 33 g/dL (ref 32.0–36.0)
MCV: 92 fL (ref 80.0–100.0)
MPV: 9.7 fL (ref 7.5–12.5)
Platelets: 265 10*3/uL (ref 140–400)
RBC: 3.89 10*6/uL (ref 3.80–5.10)
RDW: 11.7 % (ref 11.0–15.0)
WBC: 7.5 10*3/uL (ref 3.8–10.8)

## 2018-11-29 ENCOUNTER — Other Ambulatory Visit: Payer: Self-pay

## 2018-11-29 ENCOUNTER — Ambulatory Visit (INDEPENDENT_AMBULATORY_CARE_PROVIDER_SITE_OTHER): Payer: BC Managed Care – PPO

## 2018-11-29 DIAGNOSIS — Z0000 Encounter for general adult medical examination without abnormal findings: Secondary | ICD-10-CM | POA: Diagnosis not present

## 2018-11-29 DIAGNOSIS — Z1231 Encounter for screening mammogram for malignant neoplasm of breast: Secondary | ICD-10-CM

## 2018-12-01 ENCOUNTER — Other Ambulatory Visit: Payer: Self-pay | Admitting: Osteopathic Medicine

## 2018-12-01 DIAGNOSIS — R928 Other abnormal and inconclusive findings on diagnostic imaging of breast: Secondary | ICD-10-CM

## 2018-12-05 ENCOUNTER — Ambulatory Visit: Payer: BC Managed Care – PPO | Admitting: Certified Nurse Midwife

## 2018-12-05 DIAGNOSIS — Z01419 Encounter for gynecological examination (general) (routine) without abnormal findings: Secondary | ICD-10-CM

## 2019-01-09 ENCOUNTER — Other Ambulatory Visit: Payer: Self-pay | Admitting: Osteopathic Medicine

## 2019-01-09 DIAGNOSIS — B001 Herpesviral vesicular dermatitis: Secondary | ICD-10-CM

## 2019-01-09 NOTE — Telephone Encounter (Signed)
Requested medication (s) are due for refill today: yes  Requested medication (s) are on the active medication list: yes  Last refill:  12/08/2018  Future visit scheduled: no  Notes to clinic:  review for refill   Requested Prescriptions  Pending Prescriptions Disp Refills   valACYclovir (VALTREX) 1000 MG tablet [Pharmacy Med Name: VALACYCLOVIR HCL 1 GRAM TABLET] 20 tablet 2    Sig: TAKE 2 TABLETS BY MOUTH 2 TIMES DAILY. FOR ONE DAY, AS NEEDED AT FIRST SIGN OF COLD SORE OUTBREAK      Antimicrobials:  Antiviral Agents - Anti-Herpetic Passed - 01/09/2019  1:38 PM      Passed - Valid encounter within last 12 months    Recent Outpatient Visits           1 month ago Annual physical exam   [facility], Lanelle Bal, DO   7 months ago Pigmented purpuric dermatosis   [place] [facility], Lanelle Bal, DO   8 months ago Rash and nonspecific skin eruption   [facility] [place], Lanelle Bal, DO   1 year ago Annual physical exam   [facility], Lanelle Bal, DO   1 year ago Essential hypertension   [place] [facility], [place], DO

## 2019-01-09 NOTE — Telephone Encounter (Signed)
Requested medication (s) are due for refill today: no  Requested medication (s) are on the active medication list: no  Last refill:  10/12/2018  Future visit scheduled:no  Notes to clinic:  medication was discontinued    Requested Prescriptions  Pending Prescriptions Disp Refills   losartan (COZAAR) 25 MG tablet [Pharmacy Med Name: LOSARTAN POTASSIUM 25 MG TAB] 180 tablet 0    Sig: TAKE 2 TABLETS BY MOUTH A DAY      Cardiovascular:  Angiotensin Receptor Blockers Passed - 01/09/2019  2:34 AM      Passed - Cr in normal range and within 180 days    Creat  Date Value Ref Range Status  11/15/2018 0.87 0.50 - 1.05 mg/dL Final    Comment:    For patients >35 years of age, the reference limit for Creatinine is approximately 13% higher for people identified as African-American. .           Passed - K in normal range and within 180 days    Potassium  Date Value Ref Range Status  11/15/2018 4.1 3.5 - 5.3 mmol/L Final          Passed - Patient is not pregnant      Passed - Last BP in normal range    BP Readings from Last 1 Encounters:  11/15/18 110/79          Passed - Valid encounter within last 6 months    Recent Outpatient Visits           1 month ago Annual physical exam   [facility], Lanelle Bal, DO   7 months ago Pigmented purpuric dermatosis   [facility], Lanelle Bal, DO   8 months ago Rash and nonspecific skin eruption   [facility], Lanelle Bal, DO   1 year ago Annual physical exam   [facility], Lanelle Bal, DO   1 year ago Essential hypertension   [facility], [place], DO

## 2019-01-17 ENCOUNTER — Ambulatory Visit: Payer: BC Managed Care – PPO

## 2019-01-17 ENCOUNTER — Ambulatory Visit
Admission: RE | Admit: 2019-01-17 | Discharge: 2019-01-17 | Disposition: A | Discharge status: Home / Self Care | Payer: BC Managed Care – PPO | Source: Ambulatory Visit | Attending: Osteopathic Medicine | Admitting: Osteopathic Medicine

## 2019-01-17 ENCOUNTER — Other Ambulatory Visit: Payer: Self-pay

## 2019-01-17 DIAGNOSIS — R928 Other abnormal and inconclusive findings on diagnostic imaging of breast: Secondary | ICD-10-CM

## 2019-01-17 DIAGNOSIS — R922 Inconclusive mammogram: Secondary | ICD-10-CM | POA: Diagnosis not present

## 2019-01-23 DIAGNOSIS — Z03818 Encounter for observation for suspected exposure to other biological agents ruled out: Secondary | ICD-10-CM | POA: Diagnosis not present

## 2019-04-02 ENCOUNTER — Other Ambulatory Visit: Payer: Self-pay | Admitting: Osteopathic Medicine

## 2019-06-02 ENCOUNTER — Other Ambulatory Visit: Payer: Self-pay | Admitting: Osteopathic Medicine

## 2019-06-02 DIAGNOSIS — B001 Herpesviral vesicular dermatitis: Secondary | ICD-10-CM

## 2019-06-27 ENCOUNTER — Other Ambulatory Visit: Payer: Self-pay | Admitting: Osteopathic Medicine

## 2019-06-30 ENCOUNTER — Other Ambulatory Visit: Payer: Self-pay | Admitting: Osteopathic Medicine

## 2019-09-21 ENCOUNTER — Other Ambulatory Visit: Payer: Self-pay | Admitting: Osteopathic Medicine

## 2019-09-25 ENCOUNTER — Other Ambulatory Visit: Payer: Self-pay | Admitting: Osteopathic Medicine

## 2019-09-25 DIAGNOSIS — I10 Essential (primary) hypertension: Secondary | ICD-10-CM

## 2019-10-08 ENCOUNTER — Other Ambulatory Visit: Payer: Self-pay | Admitting: Osteopathic Medicine

## 2019-10-08 ENCOUNTER — Other Ambulatory Visit: Payer: Self-pay

## 2019-10-08 DIAGNOSIS — I10 Essential (primary) hypertension: Secondary | ICD-10-CM

## 2019-10-08 MED ORDER — HYDROCHLOROTHIAZIDE 25 MG PO TABS: 25.0000 mg | ORAL_TABLET | Freq: Every day | ORAL | 3 refills | Status: DC

## 2019-11-16 ENCOUNTER — Telehealth: Payer: Self-pay

## 2019-11-16 NOTE — Telephone Encounter (Signed)
Pt called multiple times requesting a treatment plan. Per pt, she tested positive for Covid yesterday. She is requesting medications to be sent to the pharmacy. Pls advise, thanks.

## 2019-11-16 NOTE — Telephone Encounter (Signed)
Last visit w/ me was 11/14/18 Needs visit

## 2019-11-16 NOTE — Telephone Encounter (Signed)
Pt has been updated. Informed of provider's note. Agreeable with plan. Pt has been scheduled on 11/19/19 to discuss symptoms in detail. No other inquiries during the call.

## 2019-11-19 ENCOUNTER — Telehealth (INDEPENDENT_AMBULATORY_CARE_PROVIDER_SITE_OTHER): Payer: BC Managed Care – PPO | Admitting: Osteopathic Medicine

## 2019-11-19 ENCOUNTER — Encounter: Payer: Self-pay | Admitting: Osteopathic Medicine

## 2019-11-19 VITALS — Wt 179.0 lb

## 2019-11-19 DIAGNOSIS — U071 COVID-19: Secondary | ICD-10-CM

## 2019-11-19 DIAGNOSIS — Z0000 Encounter for general adult medical examination without abnormal findings: Secondary | ICD-10-CM

## 2019-11-19 NOTE — Progress Notes (Signed)
Virtual Visit via Video (App used: MyChart) Note  I connected with      Jenna Hess on 11/19/19 at 10:42 AM  by a telemedicine application and verified that I am speaking with the correct person using two identifiers.  Patient is at home I am in office   I discussed the limitations of evaluation and management by telemedicine and the availability of in person appointments. The patient expressed understanding and agreed to proceed.  History of Present Illness: Jenna Hess is a 59 y.o. female who would like to discuss COVID   Symptom onset  Friday 11/09/2019 noted hip pain, thought might be due to strain  Saturday unable to really move d/t muscle aches Loss taste/smell Starting to feel improved (+)COVID test 11/14/2019 Employer is demanding negative COVID test prior to return to work      Observations/Objective: Wt 179 lb (81.2 kg)   BMI 26.05 kg/m  BP Readings from Last 3 Encounters:  11/15/18 110/79  05/18/18 137/67  05/03/18 130/63   Exam: Normal Speech.  NAD  Lab and Radiology Results No results found for this or any previous visit (from the past 72 hour(s)). No results found.     Assessment and Plan: 59 y.o. female with The primary encounter diagnosis was COVID-19. A diagnosis of Annual physical exam was also pertinent to this visit.  Labs ordered for future visit. Annual physical / preventive care was NOT performed or billed today.   Pt advised COVID testing for return to work not necessary but we can accommodate her even though her employer is being unreasonable, see letters. Otherwise patient seems to be thankfully recovering well. Will see her for annual sometime in November.     PDMP not reviewed this encounter. Orders Placed This Encounter  Procedures  . CBC  . COMPLETE METABOLIC PANEL WITH GFR  . Lipid panel     Follow Up Instructions: Return if symptoms worsen or fail to improve.    I discussed the assessment and treatment plan  with the patient. The patient was provided an opportunity to ask questions and all were answered. The patient agreed with the plan and demonstrated an understanding of the instructions.   The patient was advised to call back or seek an in-person evaluation if any new concerns, if symptoms worsen or if the condition fails to improve as anticipated.  30 minutes of non-face-to-face time was provided during this encounter.      . . . . . . . . . . . . . Marland Kitchen                   Historical information moved to improve visibility of documentation.  Past Medical History:  Diagnosis Date  . Fibroadenoma of right breast 10/25/2016   See path report 02/16/16  . Hypertension    Past Surgical History:  Procedure Laterality Date  . APPENDECTOMY  1976  . BREAST BIOPSY    . LEG SURGERY  1987  . SMALL INTESTINE SURGERY  2007   small and large   Social History   Tobacco Use  . Smoking status: Current Some Day Smoker  . Smokeless tobacco: Never Used  . Tobacco comment: As per pt, smokes 6-7 cigs   Substance Use Topics  . Alcohol use: No    Alcohol/week: 0.0 standard drinks   family history includes Heart disease in her mother.  Medications: Current Outpatient Medications  Medication Sig Dispense Refill  . betamethasone dipropionate 0.05 % cream  APPLY TOPICALLY 2 (TWO) TIMES DAILY. TO AFFECTED AREA(S) AS NEEDED 45 g 1  . cholecalciferol (VITAMIN D) 1000 units tablet Take 1,000 Units by mouth daily.    . hydrochlorothiazide (HYDRODIURIL) 25 MG tablet Take 1 tablet (25 mg total) by mouth daily. 90 tablet 3  . losartan (COZAAR) 25 MG tablet Take 2 tablets (50 mg total) by mouth daily. Needs appt 60 tablet 0  . losartan (COZAAR) 50 MG tablet TAKE 1 TABLET BY MOUTH EVERY DAY 90 tablet 3  . valACYclovir (VALTREX) 1000 MG tablet TAKE 2 TABLETS BY MOUTH 2 TIMES DAILY. FOR ONE DAY, AS NEEDED AT FIRST SIGN OF COLD SORE OUTBREAK 20 tablet 2   No current  facility-administered medications for this visit.   Allergies  Allergen Reactions  . Bee Venom Anaphylaxis  . Coconut Oil   . Codeine   . Morphine And Related

## 2019-11-19 NOTE — Progress Notes (Signed)
Attempted to contact patient at 900 am. No answer, left a detailed vm msg.

## 2019-12-10 ENCOUNTER — Other Ambulatory Visit: Payer: Self-pay | Admitting: Osteopathic Medicine

## 2019-12-10 DIAGNOSIS — B001 Herpesviral vesicular dermatitis: Secondary | ICD-10-CM

## 2019-12-17 ENCOUNTER — Ambulatory Visit (INDEPENDENT_AMBULATORY_CARE_PROVIDER_SITE_OTHER): Payer: BC Managed Care – PPO | Admitting: Osteopathic Medicine

## 2019-12-17 ENCOUNTER — Encounter: Payer: Self-pay | Admitting: Osteopathic Medicine

## 2019-12-17 VITALS — BP 109/75 | HR 91 | Temp 98.1°F | Wt 169.0 lb

## 2019-12-17 DIAGNOSIS — I10 Essential (primary) hypertension: Secondary | ICD-10-CM | POA: Diagnosis not present

## 2019-12-17 DIAGNOSIS — Z0000 Encounter for general adult medical examination without abnormal findings: Secondary | ICD-10-CM

## 2019-12-17 MED ORDER — LOSARTAN POTASSIUM 50 MG PO TABS: 50.0000 mg | ORAL_TABLET | Freq: Every day | ORAL | 3 refills | Status: DC

## 2019-12-17 MED ORDER — HYDROCHLOROTHIAZIDE 25 MG PO TABS: 25.0000 mg | ORAL_TABLET | Freq: Every day | ORAL | 3 refills | Status: DC

## 2019-12-17 NOTE — Patient Instructions (Addendum)
General Preventive Care  Most recent routine screening labs: ordered.   Blood pressure goal 130/80 or less.   Tobacco: don't!   Alcohol: responsible moderation is ok for most adults - if you have concerns about your alcohol intake, please talk to me!   Exercise: as tolerated to reduce risk of cardiovascular disease and diabetes. Strength training will also prevent osteoporosis.   Mental health: if need for mental health care (medicines, counseling, other), or concerns about moods, please let me know!   Sexual / Reproductive health: if need for STD testing, or if concerns with libido/pain problems, please let me know!   Advanced Directive: Living Will and/or Healthcare Power of Attorney recommended for all adults, regardless of age or health.  Vaccines  Flu vaccine: recommended every fall.   Shingles vaccine: after age 93. First shot is on file for you, you will need a second vaccine to be fully immune.   Pneumonia vaccines: after age 76, or sooner if certain medical conditions/smokers.  Tetanus booster: every 10 years   COVID vaccine: STRONGLY RECOMMENDED Cancer screenings   Colon cancer screening: for everyone age 96-75. Colonoscopy due 08/2027.   Breast cancer screening: mammogram annually after age 47. Due 12/2019 - orders are in, please call [phone] to schedule.   Cervical cancer screening: Last pap on file 2018 was abnormal!   Lung cancer screening: CT chest every year for those aged 82 to 54 years who have a 20 pack-year smoking history and currently smoke or have quit within the past 15 years  Infection screenings  . HIV: recommended screening at least once age 53-65, more often as needed. . Gonorrhea/Chlamydia: screening as needed. . Hepatitis C: recommended once for everyone age 60-75 . TB: certain at-risk populations, or depending on work requirements and/or travel history Other . Bone Density Test: recommended for women at age 13, sooner depending on risk  factors

## 2019-12-17 NOTE — Progress Notes (Signed)
Jenna Hess is a 59 y.o. female who presents to  [place] [facility]  today, 12/17/19, seeking care for the following:  . Annual physical  . Declines vaccines . Declines mammo, will get next year . Due for Pap - see MyChart message     ASSESSMENT & PLAN with other pertinent findings:  The encounter diagnosis was Annual physical exam.   No results found for this or any previous visit (from the past 24 hour(s)).     Patient Instructions  General Preventive Care  Most recent routine screening labs: ordered.   Blood pressure goal 130/80 or less.   Tobacco: don't!   Alcohol: responsible moderation is ok for most adults - if you have concerns about your alcohol intake, please talk to me!   Exercise: as tolerated to reduce risk of cardiovascular disease and diabetes. Strength training will also prevent osteoporosis.   Mental health: if need for mental health care (medicines, counseling, other), or concerns about moods, please let me know!   Sexual / Reproductive health: if need for STD testing, or if concerns with libido/pain problems, please let me know!   Advanced Directive: Living Will and/or Healthcare Power of Attorney recommended for all adults, regardless of age or health.  Vaccines  Flu vaccine: recommended every fall.   Shingles vaccine: after age 10. First shot is on file for you, you will need a second vaccine to be fully immune.   Pneumonia vaccines: after age 57, or sooner if certain medical conditions/smokers.  Tetanus booster: every 10 years   COVID vaccine: STRONGLY RECOMMENDED Cancer screenings   Colon cancer screening: for everyone age 8-75. Colonoscopy due 08/2027.   Breast cancer screening: mammogram annually after age 67. Due 12/2019 - orders are in, please call [phone] to schedule.   Cervical cancer screening: Last pap on file 2018 was abnormal!   Lung cancer screening: CT chest every  year for those aged 62 to 63 years who have a 20 pack-year smoking history and currently smoke or have quit within the past 15 years  Infection screenings  . HIV: recommended screening at least once age 52-65, more often as needed. . Gonorrhea/Chlamydia: screening as needed. . Hepatitis C: recommended once for everyone age 11-75 . TB: certain at-risk populations, or depending on work requirements and/or travel history Other . Bone Density Test: recommended for women at age 61, sooner depending on risk factors   No orders of the defined types were placed in this encounter.   No orders of the defined types were placed in this encounter.      Follow-up instructions: Return in about 1 year (around 12/16/2020) for ANNUAL CHECK-UP - SEE [place] SOONER IF NEEDED.                                         BP 109/75 (BP Location: Left Arm, Patient Position: Sitting, Cuff Size: Normal)   Pulse 91   Temp 98.1 F (36.7 C) (Oral)   Wt 169 lb (76.7 kg)   BMI 24.60 kg/m   Constitutional:  . VSS, see nurse notes . General Appearance: alert, well-developed, well-nourished, NAD Eyes: Marland Kitchen Normal lids and conjunctive, non-icteric sclera Neck: . No masses, trachea midline . No thyroid enlargement/tenderness/mass appreciated Respiratory: . Normal respiratory effort . Breath sounds normal, no wheeze/rhonchi/rales Cardiovascular: . S1/S2 normal, no murmur/rub/gallop auscultated . No carotid  bruit or JVD . No lower extremity edema Gastrointestinal: . Nontender, no masses . No hepatomegaly, no splenomegaly . No hernia appreciated Musculoskeletal:  . Gait normal . No clubbing/cyanosis of digits Neurological: . No cranial nerve deficit on limited exam . Motor and sensation intact and symmetric Psychiatric: . Normal judgment/insight . Normal mood and affect   Current Meds  Medication Sig  . betamethasone dipropionate 0.05 % cream APPLY TOPICALLY 2 (TWO)  TIMES DAILY. TO AFFECTED AREA(S) AS NEEDED  . cholecalciferol (VITAMIN D) 1000 units tablet Take 1,000 Units by mouth daily.  . hydrochlorothiazide (HYDRODIURIL) 25 MG tablet Take 1 tablet (25 mg total) by mouth daily.  Marland Kitchen losartan (COZAAR) 25 MG tablet TAKE 2 TABLETS (50 MG TOTAL) BY MOUTH DAILY. NEEDS APPT  . losartan (COZAAR) 50 MG tablet TAKE 1 TABLET BY MOUTH EVERY DAY  . valACYclovir (VALTREX) 1000 MG tablet TAKE 2 TABLETS BY MOUTH 2 TIMES DAILY. FOR ONE DAY, AS NEEDED AT FIRST SIGN OF COLD SORE OUTBREAK    No results found for this or any previous visit (from the past 25 hour(s)).  No results found.     All questions at time of visit were answered - patient instructed to contact office with any additional concerns or updates.  ER/RTC precautions were reviewed with the patient as applicable.   Please note: voice recognition software was used to produce this document, and typos may escape review. Please contact Dr. Sheppard Coil for any needed clarifications.

## 2020-01-02 ENCOUNTER — Other Ambulatory Visit: Payer: Self-pay | Admitting: Osteopathic Medicine

## 2020-03-10 ENCOUNTER — Other Ambulatory Visit: Payer: Self-pay | Admitting: Osteopathic Medicine

## 2020-03-12 ENCOUNTER — Other Ambulatory Visit: Payer: Self-pay

## 2020-03-12 DIAGNOSIS — B001 Herpesviral vesicular dermatitis: Secondary | ICD-10-CM

## 2020-03-12 DIAGNOSIS — I10 Essential (primary) hypertension: Secondary | ICD-10-CM

## 2020-03-12 MED ORDER — VALACYCLOVIR HCL 1 G PO TABS: ORAL_TABLET | ORAL | 2 refills | Status: DC

## 2020-03-12 MED ORDER — BETAMETHASONE DIPROPIONATE 0.05 % EX CREA
TOPICAL_CREAM | Freq: Two times a day (BID) | CUTANEOUS | 1 refills | Status: DC
Start: 2020-03-12 — End: 2021-11-12

## 2020-03-12 MED ORDER — LOSARTAN POTASSIUM 50 MG PO TABS: 50.0000 mg | ORAL_TABLET | Freq: Every day | ORAL | 1 refills | Status: DC

## 2020-03-12 MED ORDER — HYDROCHLOROTHIAZIDE 25 MG PO TABS: 25.0000 mg | ORAL_TABLET | Freq: Every day | ORAL | 1 refills | Status: DC

## 2020-07-07 ENCOUNTER — Telehealth: Payer: Self-pay

## 2020-07-07 NOTE — Telephone Encounter (Signed)
Patient left a vm msg with concerns of conjunctivitis. Per pt, her eye is really bad - very itchy, red with discharge. Patient is requesting for provider to send in an eye drop rx. Please contact patient to schedule an in office visit with provider. Thanks in advance.

## 2020-07-07 NOTE — Telephone Encounter (Signed)
Patient has been scheduled for in the morning with Dr A. AM

## 2020-07-08 ENCOUNTER — Other Ambulatory Visit: Payer: Self-pay

## 2020-07-08 ENCOUNTER — Ambulatory Visit (INDEPENDENT_AMBULATORY_CARE_PROVIDER_SITE_OTHER): Payer: BC Managed Care – PPO | Admitting: Osteopathic Medicine

## 2020-07-08 ENCOUNTER — Encounter: Payer: Self-pay | Admitting: Osteopathic Medicine

## 2020-07-08 VITALS — BP 145/69 | HR 79 | Temp 98.4°F | Wt 179.1 lb

## 2020-07-08 DIAGNOSIS — H1031 Unspecified acute conjunctivitis, right eye: Secondary | ICD-10-CM

## 2020-07-08 MED ORDER — FEXOFENADINE HCL 180 MG PO TABS: 180.0000 mg | ORAL_TABLET | Freq: Every day | ORAL | 0 refills | Status: DC

## 2020-07-08 MED ORDER — ERYTHROMYCIN 5 MG/GM OP OINT: 1.0000 "application " | TOPICAL_OINTMENT | Freq: Three times a day (TID) | OPHTHALMIC | 1 refills | Status: AC

## 2020-07-08 NOTE — Progress Notes (Signed)
Jenna Hess is a 60 y.o. female who presents to  [place] [facility]  today, 07/08/20, seeking care for the following:  Red eye x1 day, medial R eye redness, noted some drainage mostly clear/yellow dried on the eye, once bit of more goopy drainage. Minimal itching. No pain, no vivion change, no swelling/pain in lid. On exam, injection of the conjunctiva but no edema/erythema, no drainage, lid appears normal on R. L eye mild conjunctival injection but otherwise normal. PERRL, EOMI B/L     ASSESSMENT & PLAN with other pertinent findings:  The encounter diagnosis was Acute conjunctivitis of right eye, unspecified acute conjunctivitis type.   Low suspicion for bacterial infection Trial erythromycin for 3-5 days  Antihistamine Rx also sent or can use OTC  Ophtho referral if worse/change   There are no Patient Instructions on file for this visit.  No orders of the defined types were placed in this encounter.   Meds ordered this encounter  Medications   erythromycin ophthalmic ointment    Sig: Place 1 application into both eyes 3 (three) times daily for 5 days. Apply 1 inch ribbon to affected eye TID for 5 days.    Dispense:  3.5 g    Refill:  1   fexofenadine (ALLEGRA) 180 MG tablet    Sig: Take 1 tablet (180 mg total) by mouth daily.    Dispense:  90 tablet    Refill:  0     See below for relevant physical exam findings  See below for recent lab and imaging results reviewed  Medications, allergies, PMH, PSH, SocH, FamH reviewed below    Follow-up instructions: Return if symptoms worsen or fail to improve.                                        Exam:  BP (!) 145/69 (BP Location: Left Arm, Patient Position: Sitting, Cuff Size: Large)   Pulse 79   Temp 98.4 F (36.9 C) (Oral)   Wt 179 lb 1.3 oz (81.2 kg)   BMI 26.07 kg/m  Constitutional: VS see above. General Appearance: alert,  well-developed, well-nourished, NAD EYES: see above  Neck: No masses, trachea midline.  Respiratory: Normal respiratory effort.  Musculoskeletal: Gait normal. Symmetric and independent movement of all extremities Neurological: Normal balance/coordination. No tremor. Skin: warm, dry, intact.  Psychiatric: Normal judgment/insight. Normal mood and affect. Oriented x3.   Current Meds  Medication Sig   betamethasone dipropionate 0.05 % cream Apply topically 2 (two) times daily. To affected area(s) as needed   cholecalciferol (VITAMIN D) 1000 units tablet Take 1,000 Units by mouth daily.   erythromycin ophthalmic ointment Place 1 application into both eyes 3 (three) times daily for 5 days. Apply 1 inch ribbon to affected eye TID for 5 days.   fexofenadine (ALLEGRA) 180 MG tablet Take 1 tablet (180 mg total) by mouth daily.   hydrochlorothiazide (HYDRODIURIL) 25 MG tablet Take 1 tablet (25 mg total) by mouth daily.   losartan (COZAAR) 50 MG tablet Take 1 tablet (50 mg total) by mouth daily.   valACYclovir (VALTREX) 1000 MG tablet TAKE 2 TABLETS BY MOUTH 2 TIMES DAILY. FOR ONE DAY, AS NEEDED AT FIRST SIGN OF COLD SORE OUTBREAK    Allergies  Allergen Reactions   Bee Venom Anaphylaxis   Coconut Oil    Codeine    Morphine And  Related     Patient Active Problem List   Diagnosis Date Noted   Pigmented purpuric dermatosis 05/18/2018   Atypical squamous cells of undetermined significance (ASCUS) on Papanicolaou smear of cervix 11/05/2016   HPV (human papilloma virus) infection 11/05/2016   Fibroadenoma of right breast 10/25/2016   Cold sore 02/03/2016   Essential hypertension 07/03/2015   Tobacco use 07/03/2015   Postmenopausal 07/03/2015    Family History  Problem Relation Age of Onset   Heart disease Mother     Social History   Tobacco Use  Smoking Status Some Days   Pack years: 0.00  Smokeless Tobacco Never  Tobacco Comments   As per pt, smokes 6-7 cigs     Past Surgical  History:  Procedure Laterality Date   [place] SURGERY  2007   small and large    Immunization History  Administered Date(s) Administered   Hepatitis B 01/07/2016   Hepatitis B, adult 01/07/2016, 02/09/2016, 07/06/2016   Influenza,inj,Quad PF,6+ Mos 10/25/2016, 11/14/2017, 11/15/2018   Pneumococcal Polysaccharide-23 11/15/2018   Tdap 11/14/2017   Zoster Recombinat (Shingrix) 11/15/2018    No results found for this or any previous visit (from the past 2160 hour(s)).  No results found.     All questions at time of visit were answered - patient instructed to contact office with any additional concerns or updates. ER/RTC precautions were reviewed with the patient as applicable.   Please note: manual typing as well as voice recognition software may have been used to produce this document - typos may escape review. Please contact Dr. Sheppard Coil for any needed clarifications.

## 2020-09-04 ENCOUNTER — Other Ambulatory Visit: Payer: Self-pay | Admitting: Osteopathic Medicine

## 2020-09-04 DIAGNOSIS — I10 Essential (primary) hypertension: Secondary | ICD-10-CM

## 2020-12-09 ENCOUNTER — Other Ambulatory Visit: Payer: Self-pay | Admitting: Osteopathic Medicine

## 2020-12-09 DIAGNOSIS — I10 Essential (primary) hypertension: Secondary | ICD-10-CM

## 2021-03-10 ENCOUNTER — Other Ambulatory Visit: Payer: Self-pay | Admitting: Osteopathic Medicine

## 2021-03-10 DIAGNOSIS — I10 Essential (primary) hypertension: Secondary | ICD-10-CM

## 2021-04-06 ENCOUNTER — Other Ambulatory Visit: Payer: Self-pay | Admitting: Family Medicine

## 2021-04-06 DIAGNOSIS — I10 Essential (primary) hypertension: Secondary | ICD-10-CM

## 2021-06-09 ENCOUNTER — Other Ambulatory Visit: Payer: Self-pay | Admitting: Osteopathic Medicine

## 2021-06-09 DIAGNOSIS — I10 Essential (primary) hypertension: Secondary | ICD-10-CM

## 2021-07-06 ENCOUNTER — Other Ambulatory Visit: Payer: Self-pay | Admitting: Family Medicine

## 2021-07-06 DIAGNOSIS — I10 Essential (primary) hypertension: Secondary | ICD-10-CM

## 2021-07-12 IMAGING — MG DIGITAL SCREENING BILAT W/ TOMO W/ CAD
6 of 10 series · 6 of 30 positions shown · non-contrast
Comparison: Previous exam(s).

CLINICAL DATA: Screening.

EXAM:
DIGITAL SCREENING BILATERAL MAMMOGRAM WITH TOMO AND CAD

[R MLO synth-2D]
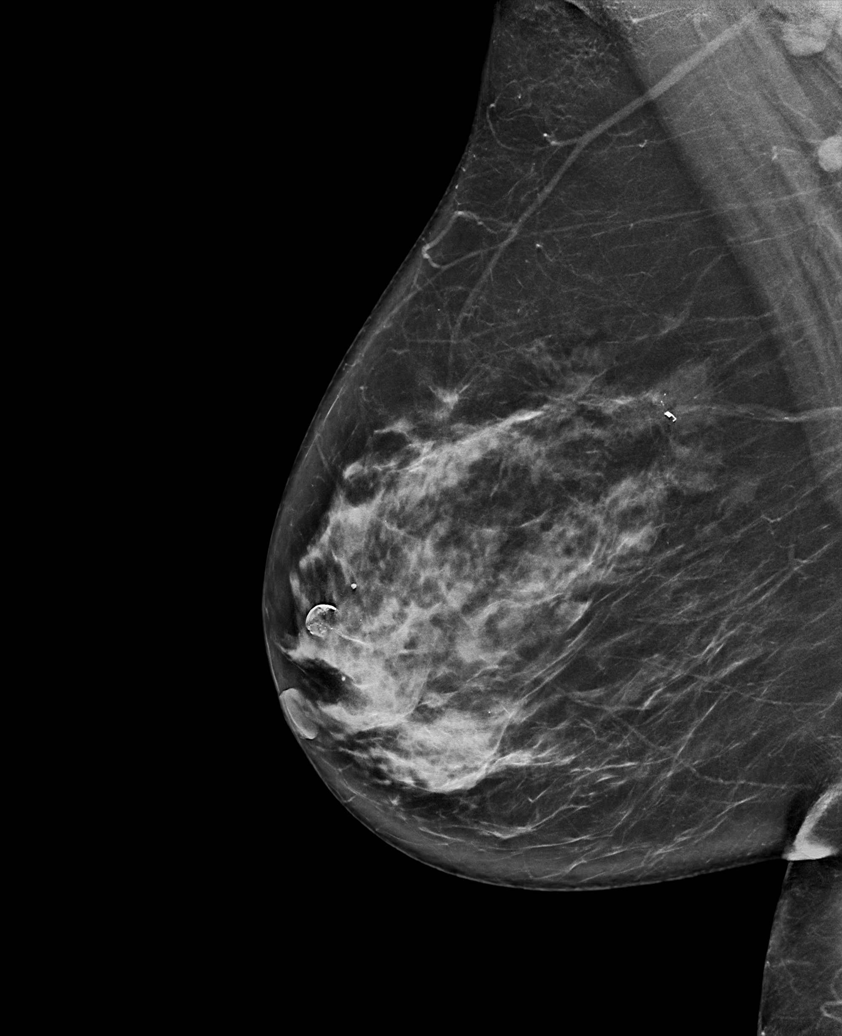

[L CC synth-2D]
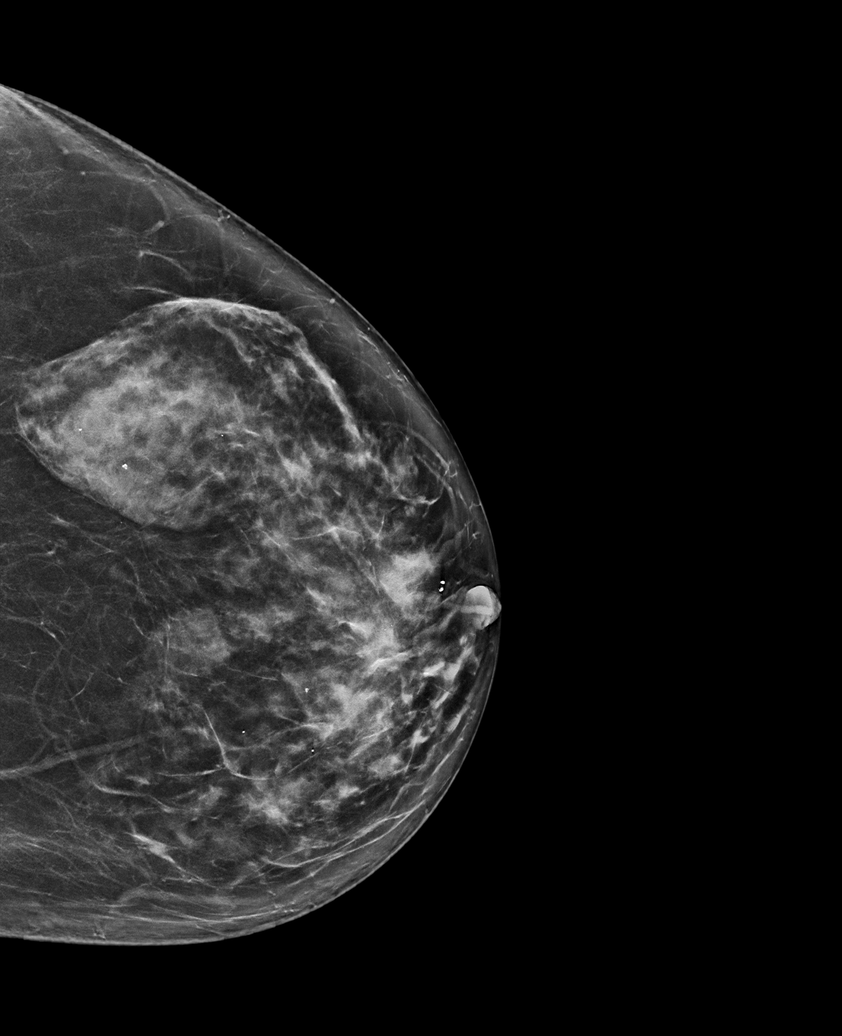

[L MLO synth-2D]
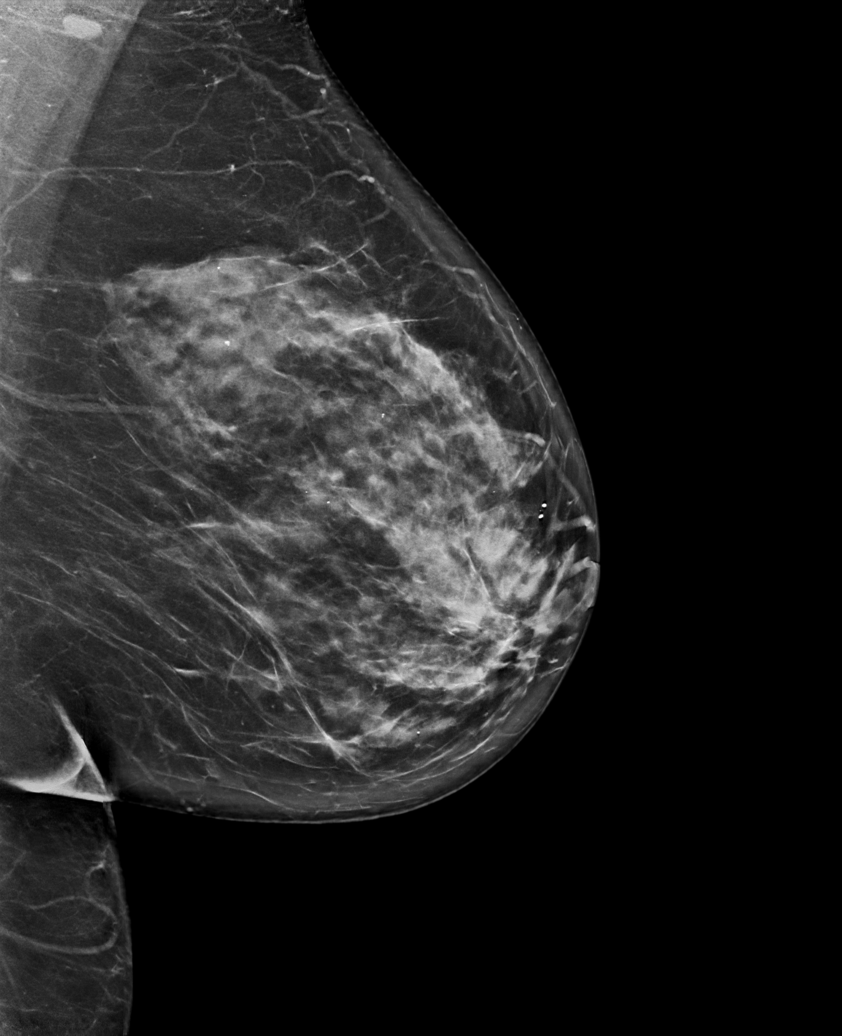

[R XCCL synth-2D]
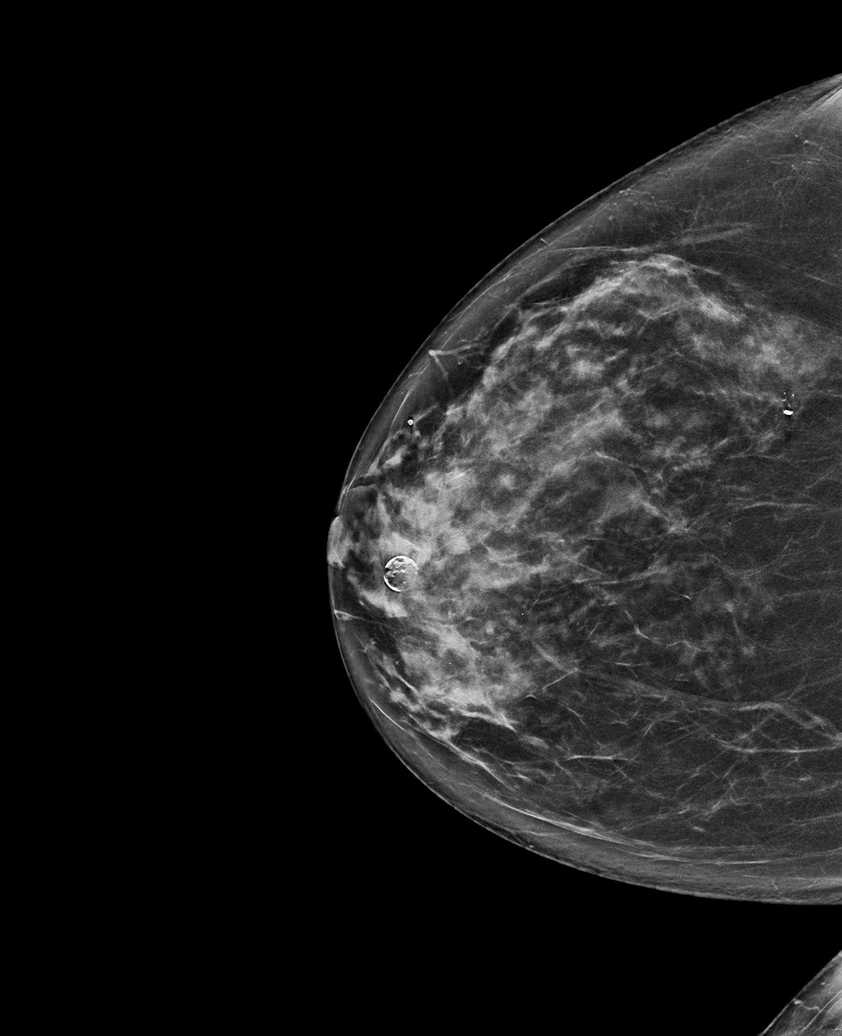

[R CC synth-2D]
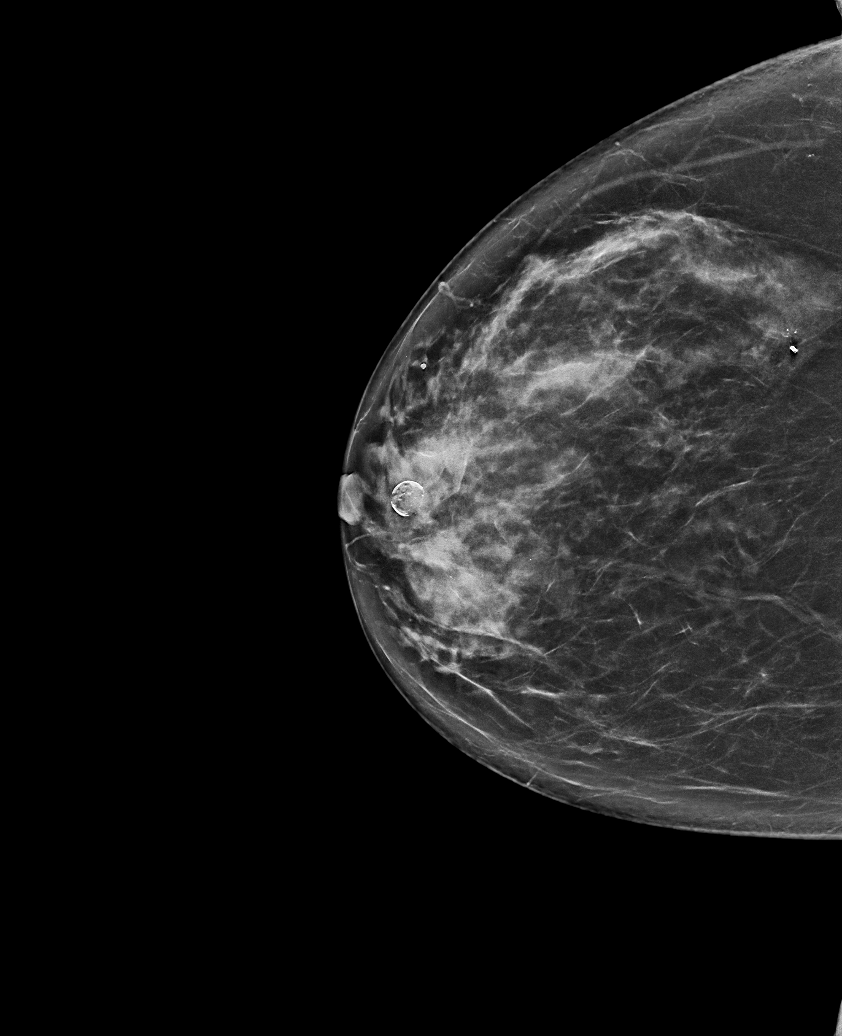

[L CC tomo · tomo slice 43/84.0]
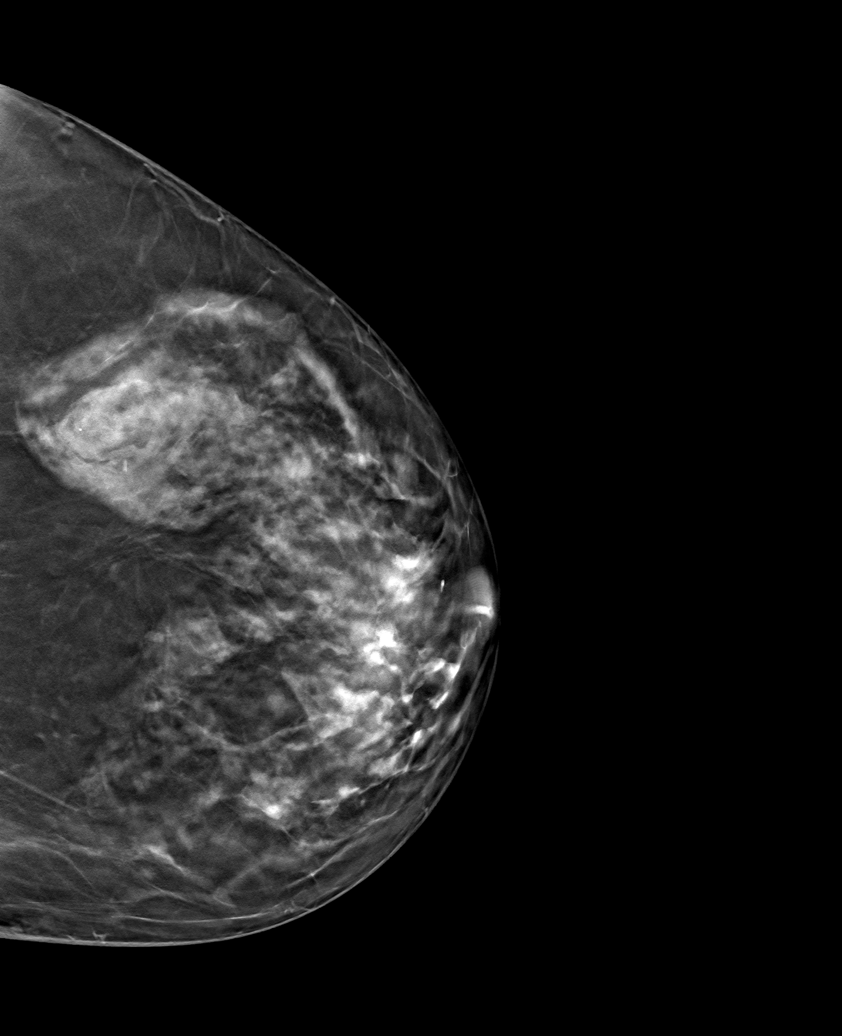

[6 of 30 positions shown; findings below may reference images not displayed]

ACR Breast Density Category c: The breast tissue is heterogeneously
dense, which may obscure small masses.
FINDINGS: In the right breast, possible distortion warrants further
evaluation. In the left breast, no findings suspicious for
malignancy. Images were processed with CAD.
IMPRESSION: Further evaluation is suggested for possible distortion in the right
breast.

RECOMMENDATION:
Diagnostic mammogram and possibly ultrasound of the right breast.
(Code:[id number])

The patient will be contacted regarding the findings, and additional
imaging will be scheduled.

BI-RADS CATEGORY  0: Incomplete. Need additional imaging evaluation
and/or prior mammograms for comparison.

## 2021-09-13 ENCOUNTER — Other Ambulatory Visit: Payer: Self-pay | Admitting: Family Medicine

## 2021-09-13 DIAGNOSIS — I10 Essential (primary) hypertension: Secondary | ICD-10-CM

## 2021-09-27 ENCOUNTER — Other Ambulatory Visit: Payer: Self-pay | Admitting: Family Medicine

## 2021-09-27 DIAGNOSIS — I10 Essential (primary) hypertension: Secondary | ICD-10-CM

## 2021-10-16 ENCOUNTER — Other Ambulatory Visit: Payer: Self-pay | Admitting: Family Medicine

## 2021-10-16 DIAGNOSIS — I10 Essential (primary) hypertension: Secondary | ICD-10-CM

## 2021-11-01 ENCOUNTER — Other Ambulatory Visit: Payer: Self-pay | Admitting: Family Medicine

## 2021-11-01 DIAGNOSIS — I10 Essential (primary) hypertension: Secondary | ICD-10-CM

## 2021-11-05 ENCOUNTER — Other Ambulatory Visit: Payer: Self-pay | Admitting: Family Medicine

## 2021-11-05 DIAGNOSIS — I10 Essential (primary) hypertension: Secondary | ICD-10-CM

## 2021-11-12 ENCOUNTER — Ambulatory Visit (INDEPENDENT_AMBULATORY_CARE_PROVIDER_SITE_OTHER): Payer: BC Managed Care – PPO | Admitting: Family Medicine

## 2021-11-12 ENCOUNTER — Encounter: Payer: Self-pay | Admitting: Family Medicine

## 2021-11-12 VITALS — BP 134/83 | HR 83 | Ht 69.5 in | Wt 183.0 lb

## 2021-11-12 DIAGNOSIS — Z0000 Encounter for general adult medical examination without abnormal findings: Secondary | ICD-10-CM

## 2021-11-12 DIAGNOSIS — I10 Essential (primary) hypertension: Secondary | ICD-10-CM | POA: Diagnosis not present

## 2021-11-12 DIAGNOSIS — B001 Herpesviral vesicular dermatitis: Secondary | ICD-10-CM | POA: Diagnosis not present

## 2021-11-12 DIAGNOSIS — Z716 Tobacco abuse counseling: Secondary | ICD-10-CM | POA: Insufficient documentation

## 2021-11-12 DIAGNOSIS — Z760 Encounter for issue of repeat prescription: Secondary | ICD-10-CM

## 2021-11-12 DIAGNOSIS — Z1231 Encounter for screening mammogram for malignant neoplasm of breast: Secondary | ICD-10-CM | POA: Diagnosis not present

## 2021-11-12 DIAGNOSIS — J302 Other seasonal allergic rhinitis: Secondary | ICD-10-CM

## 2021-11-12 MED ORDER — VALACYCLOVIR HCL 1 G PO TABS: ORAL_TABLET | ORAL | 2 refills | Status: DC

## 2021-11-12 MED ORDER — LOSARTAN POTASSIUM 50 MG PO TABS: 50.0000 mg | ORAL_TABLET | Freq: Every day | ORAL | 0 refills | Status: DC

## 2021-11-12 MED ORDER — HYDROCHLOROTHIAZIDE 25 MG PO TABS: 25.0000 mg | ORAL_TABLET | Freq: Every day | ORAL | 0 refills | Status: DC

## 2021-11-12 MED ORDER — BETAMETHASONE DIPROPIONATE 0.05 % EX CREA: TOPICAL_CREAM | Freq: Two times a day (BID) | CUTANEOUS | 1 refills | Status: AC

## 2021-11-12 MED ORDER — FEXOFENADINE HCL 180 MG PO TABS: 180.0000 mg | ORAL_TABLET | Freq: Every day | ORAL | 0 refills | Status: AC

## 2021-11-12 NOTE — Progress Notes (Signed)
Annual Wellness Visit     Patient: Jenna Hess, Female    DOB: 02/22/1960, 61 y.o.   MRN: [id number]  Subjective  Chief Complaint  Patient presents with   Annual Exam    Jenna Hess is a 61 y.o. female who presents today for her Annual Wellness Visit.  Diet: works two full time jobs Exercise: exercises regularly at work about 14,000 steps at work   Screenings: Colon Cancer screenings (start at age 72): 2019  Pap smears: 2018 with HPV and ASCUS Will need pap today. Mammograms: needs one   Family hx: HTN: yes DM: no Cancer: no  Social hx: Alcohol use: occasionally  Tobacco (chew, smoke): no Sexually active: No  Who do you live with: alone House or apt? house Safe at home: yes, has close neighbors  Medications: Outpatient Medications Prior to Visit  Medication Sig   cholecalciferol (VITAMIN D) 1000 units tablet Take 1,000 Units by mouth daily.   [DISCONTINUED] betamethasone dipropionate 0.05 % cream Apply topically 2 (two) times daily. To affected area(s) as needed   [DISCONTINUED] fexofenadine (ALLEGRA) 180 MG tablet Take 1 tablet (180 mg total) by mouth daily.   [DISCONTINUED] hydrochlorothiazide (HYDRODIURIL) 25 MG tablet TAKE 1 TABLET (25 MG TOTAL) BY MOUTH DAILY. LAST REFILL. NEEDS TO TRANSFER CARE TO NEW PCP.   [DISCONTINUED] losartan (COZAAR) 50 MG tablet TAKE 1 TABLET (50 MG TOTAL) BY MOUTH DAILY. NO REFILLS. NEEDS TO TRANSFER CARE TO NEW PCP.   [DISCONTINUED] valACYclovir (VALTREX) 1000 MG tablet TAKE 2 TABLETS BY MOUTH 2 TIMES DAILY. FOR ONE DAY, AS NEEDED AT FIRST SIGN OF COLD SORE OUTBREAK   No facility-administered medications prior to visit.    Allergies  Allergen Reactions   Bee Venom Anaphylaxis   Coconut (Cocos Nucifera)    Codeine    Morphine And Related     Patient Care Team: Owens Loffler, DO as PCP - General (Family Medicine)  Review of Systems  Constitutional:  Negative for chills and fever.  Respiratory:  Negative for cough  and shortness of breath.   Cardiovascular:  Negative for chest pain.  Neurological:  Negative for headaches.        Objective  BP 134/83   Pulse 83   Ht 5' 9.5" (1.765 m)   Wt 183 lb (83 kg)   SpO2 100%   BMI 26.64 kg/m    Physical Exam Vitals and nursing note reviewed.  Constitutional:      General: She is not in acute distress.    Appearance: Normal appearance.  HENT:     Head: Normocephalic and atraumatic.     Right Ear: Tympanic membrane, ear canal and external ear normal.     Left Ear: Tympanic membrane, ear canal and external ear normal.     Nose: Nose normal.  Eyes:     Conjunctiva/sclera: Conjunctivae normal.  Cardiovascular:     Rate and Rhythm: Normal rate and regular rhythm.  Pulmonary:     Effort: Pulmonary effort is normal.     Breath sounds: Normal breath sounds.  Abdominal:     General: Abdomen is flat. Bowel sounds are normal.     Palpations: Abdomen is soft.  Musculoskeletal:     Comments: 5/5 muscle strength upper and lower extremity bilaterally  Neurological:     General: No focal deficit present.     Mental Status: She is alert and oriented to person, place, and time.  Psychiatric:        Mood and Affect:  Mood normal.        Behavior: Behavior normal.        Thought Content: Thought content normal.        Judgment: Judgment normal.       Most recent functional status assessment:     No data to display         Most recent fall risk assessment:    11/12/2021    4:07 PM  [place] in the past year? 0  Number falls in past yr: 0  Injury with Fall? 0  Risk for fall due to : No Fall Risks  Follow up Falls evaluation completed    Most recent depression screenings:    11/12/2021    4:07 PM 12/17/2019    1:14 PM  PHQ 2/9 Scores  PHQ - 2 Score 0 0   Most recent cognitive screening:     No data to display         Most recent Audit-C alcohol use screening     No data to display         A score of 3 or more in  women, and 4 or more in men indicates increased risk for alcohol abuse, EXCEPT if all of the points are from question 1      Assessment & Plan   Annual wellness visit done today including the all of the following: Reviewed patient's Family Medical History Reviewed and updated list of patient's medical providers Assessment of cognitive impairment was done Assessed patient's functional ability Established a written schedule for health screening [place] Completed and Reviewed  Exercise Activities and Dietary recommendations  Goals   None     Immunization History  Administered Date(s) Administered   Hepatitis B 01/07/2016   Hepatitis B, adult 01/07/2016, 02/09/2016, 07/06/2016   Influenza,inj,Quad PF,6+ Mos 10/25/2016, 11/14/2017, 11/15/2018   Pneumococcal Polysaccharide-23 11/15/2018   Tdap 11/14/2017   Zoster Recombinat (Shingrix) 11/15/2018    Health Maintenance  Topic Date Due   COVID-19 Vaccine (1) Never done   Zoster Vaccines- Shingrix (2 of 2) 01/10/2019   PAP SMEAR-Modifier  10/26/2019   MAMMOGRAM  11/28/2020   INFLUENZA VACCINE  08/25/2021   COLONOSCOPY (Pts 45-44yr Insurance coverage will need to be confirmed)  09/24/2027   TETANUS/TDAP  11/15/2027   Hepatitis C Screening  Completed   HIV Screening  Completed   HPV VACCINES  Aged Out     Discussed health benefits of physical activity, and encouraged her to engage in regular exercise appropriate for her age and condition.    Problem List Items Addressed This Visit       Cardiovascular and Mediastinum   Essential hypertension   Relevant Medications   hydrochlorothiazide (HYDRODIURIL) 25 MG tablet   losartan (COZAAR) 50 MG tablet     Digestive   Cold sore   Relevant Medications   valACYclovir (VALTREX) 1000 MG tablet     Other   Routine adult health maintenance - Primary    - ordering CBC, CMP, lipid panel  - pt refuses updated pap smear. Did explain importance of this. Last  pap smear in 2018 was abnormal and reflexed to biopsy - pt agreeable to mammogram but says they always find a spot on her left breast and want to further investigate. She says if the mammogram recommends further testing, she does not want to undergo this.  - pt had colonoscopy in 2019       Relevant Orders  CBC   Lipid panel   COMPLETE METABOLIC PANEL WITH GFR   Other Visit Diagnoses     Screening mammogram for breast cancer       Relevant Orders   MM DIGITAL SCREENING BILATERAL   Seasonal allergies       Relevant Medications   fexofenadine (ALLEGRA) 180 MG tablet   Medication refill       Relevant Medications   betamethasone dipropionate 0.05 % cream       Return in about 6 months (around 05/14/2022) for HTN follow up.     Owens Loffler, DO

## 2021-11-12 NOTE — Assessment & Plan Note (Signed)
-   ordering CBC, CMP, lipid panel  - pt refuses updated pap smear. Did explain importance of this. Last pap smear in 2018 was abnormal and reflexed to biopsy - pt agreeable to mammogram but says they always find a spot on her left breast and want to further investigate. She says if the mammogram recommends further testing, she does not want to undergo this.  - pt had colonoscopy in 2019

## 2021-11-13 LAB — COMPLETE METABOLIC PANEL WITHOUT GFR
AG Ratio: 1.7 (calc) (ref 1.0–2.5)
ALT: 10 U/L (ref 6–29)
AST: 14 U/L (ref 10–35)
Albumin: 4.3 g/dL (ref 3.6–5.1)
Alkaline phosphatase (APISO): 96 U/L (ref 37–153)
BUN: 13 mg/dL (ref 7–25)
CO2: 32 mmol/L (ref 20–32)
Calcium: 9.9 mg/dL (ref 8.6–10.4)
Chloride: 104 mmol/L (ref 98–110)
Creat: 0.74 mg/dL (ref 0.50–1.03)
Globulin: 2.6 g/dL (ref 1.9–3.7)
Glucose, Bld: 82 mg/dL (ref 65–99)
Potassium: 4.1 mmol/L (ref 3.5–5.3)
Sodium: 141 mmol/L (ref 135–146)
Total Bilirubin: 0.4 mg/dL (ref 0.2–1.2)
Total Protein: 6.9 g/dL (ref 6.1–8.1)
eGFR: 93 mL/min/1.73m2

## 2021-11-13 LAB — CBC
HCT: 35.8 % (ref 35.0–45.0)
Hemoglobin: 12.1 g/dL (ref 11.7–15.5)
MCH: 31.2 pg (ref 27.0–33.0)
MCHC: 33.8 g/dL (ref 32.0–36.0)
MCV: 92.3 fL (ref 80.0–100.0)
MPV: 9.6 fL (ref 7.5–12.5)
Platelets: 315 Thousand/uL (ref 140–400)
RBC: 3.88 Million/uL (ref 3.80–5.10)
RDW: 11.7 % (ref 11.0–15.0)
WBC: 7.7 Thousand/uL (ref 3.8–10.8)

## 2021-11-13 LAB — LIPID PANEL
Cholesterol: 239 mg/dL — ABNORMAL HIGH
HDL: 64 mg/dL
LDL Cholesterol (Calc): 149 mg/dL — ABNORMAL HIGH
Non-HDL Cholesterol (Calc): 175 mg/dL — ABNORMAL HIGH
Total CHOL/HDL Ratio: 3.7 (calc)
Triglycerides: 139 mg/dL

## 2021-11-30 ENCOUNTER — Other Ambulatory Visit: Payer: Self-pay | Admitting: Family Medicine

## 2021-11-30 DIAGNOSIS — I10 Essential (primary) hypertension: Secondary | ICD-10-CM

## 2022-06-20 ENCOUNTER — Other Ambulatory Visit: Payer: Self-pay | Admitting: Family Medicine

## 2022-06-20 DIAGNOSIS — I10 Essential (primary) hypertension: Secondary | ICD-10-CM

## 2022-07-09 ENCOUNTER — Other Ambulatory Visit: Payer: Self-pay | Admitting: Family Medicine

## 2022-07-09 DIAGNOSIS — B001 Herpesviral vesicular dermatitis: Secondary | ICD-10-CM

## 2022-07-09 MED ORDER — VALACYCLOVIR HCL 1 G PO TABS: ORAL_TABLET | ORAL | 2 refills | Status: DC

## 2022-07-09 NOTE — Telephone Encounter (Signed)
Message sent to patient via Mychart.

## 2022-07-09 NOTE — Telephone Encounter (Signed)
Patient is requesting refills for Valtrex 1000mg   Please submit to [facility] [address]                              [place] [address]

## 2022-07-09 NOTE — Telephone Encounter (Signed)
Requesting valtrex 1,000mg  Last written 11/12/2021 Last OV 11/12/2021 Next scheduled appt 07/28/2022

## 2022-07-19 ENCOUNTER — Other Ambulatory Visit: Payer: Self-pay | Admitting: Family Medicine

## 2022-07-19 DIAGNOSIS — I10 Essential (primary) hypertension: Secondary | ICD-10-CM

## 2022-07-23 ENCOUNTER — Other Ambulatory Visit: Payer: Self-pay | Admitting: Family Medicine

## 2022-07-23 DIAGNOSIS — I10 Essential (primary) hypertension: Secondary | ICD-10-CM

## 2022-07-28 ENCOUNTER — Encounter: Payer: Self-pay | Admitting: Family Medicine

## 2022-07-28 ENCOUNTER — Ambulatory Visit (INDEPENDENT_AMBULATORY_CARE_PROVIDER_SITE_OTHER): Payer: BC Managed Care – PPO | Admitting: Family Medicine

## 2022-07-28 VITALS — BP 148/97 | HR 80 | Resp 20 | Ht 69.5 in | Wt 176.2 lb

## 2022-07-28 DIAGNOSIS — Z716 Tobacco abuse counseling: Secondary | ICD-10-CM

## 2022-07-28 DIAGNOSIS — I10 Essential (primary) hypertension: Secondary | ICD-10-CM

## 2022-07-28 DIAGNOSIS — B001 Herpesviral vesicular dermatitis: Secondary | ICD-10-CM

## 2022-07-28 MED ORDER — VALACYCLOVIR HCL 1 G PO TABS
ORAL_TABLET | ORAL | 1 refills | Status: AC
Start: 2022-07-28 — End: ?

## 2022-07-28 MED ORDER — HYDROCHLOROTHIAZIDE 25 MG PO TABS: 25.0000 mg | ORAL_TABLET | Freq: Every day | ORAL | 1 refills | Status: DC

## 2022-07-28 MED ORDER — LOSARTAN POTASSIUM 50 MG PO TABS: 50.0000 mg | ORAL_TABLET | Freq: Every day | ORAL | 1 refills | Status: DC

## 2022-07-28 NOTE — Progress Notes (Signed)
Established patient visit   Patient: Jenna Hess   DOB: 1961/01/16   62 y.o. Female  MRN: [id number] Visit Date: 07/28/2022  Today's healthcare provider: Charlton Amor, DO   No chief complaint on file.   SUBJECTIVE   No chief complaint on file.  HPI  HTN - losartan 50mg  daily  - hydrochlorothiazide 25mg   - BP 148/97 - she has not been taking her hydrochlorothiazide 25mg    Review of Systems  Constitutional:  Negative for activity change, fatigue and fever.  Respiratory:  Negative for cough and shortness of breath.   Cardiovascular:  Negative for chest pain.  Gastrointestinal:  Negative for abdominal pain.  Genitourinary:  Negative for difficulty urinating.       Current Meds  Medication Sig   betamethasone dipropionate 0.05 % cream Apply topically 2 (two) times daily. To affected area(s) as needed   cholecalciferol (VITAMIN D) 1000 units tablet Take 1,000 Units by mouth daily.   fexofenadine (ALLEGRA) 180 MG tablet Take 1 tablet (180 mg total) by mouth daily.   [DISCONTINUED] losartan (COZAAR) 50 MG tablet Take 1 tablet (50 mg total) by mouth daily. NO REFILLS. OVERDUE FOR A VISIT.    OBJECTIVE    BP (!) 148/97 (BP Location: Left Arm, Patient Position: Sitting, Cuff Size: Normal)   Pulse 80   Resp 20   Ht 5' 9.5" (1.765 m)   Wt 176 lb 4 oz (79.9 kg)   SpO2 100%   BMI 25.65 kg/m   Physical Exam Vitals and nursing note reviewed.  Constitutional:      General: She is not in acute distress.    Appearance: Normal appearance.  HENT:     Head: Normocephalic and atraumatic.     Right Ear: External ear normal.     Left Ear: External ear normal.     Nose: Nose normal.  Eyes:     Conjunctiva/sclera: Conjunctivae normal.  Cardiovascular:     Rate and Rhythm: Normal rate and regular rhythm.  Pulmonary:     Effort: Pulmonary effort is normal.     Breath sounds: Normal breath sounds.  Neurological:     General: No focal deficit present.     Mental Status:  She is alert and oriented to person, place, and time.  Psychiatric:        Mood and Affect: Mood normal.        Behavior: Behavior normal.        Thought Content: Thought content normal.        Judgment: Judgment normal.        ASSESSMENT & PLAN    Problem List Items Addressed This Visit       Cardiovascular and Mediastinum   Essential hypertension - Primary    - continue losartan and hydrochlorothiazide  - refills provided  - follow up in 2-4 weeks for nurse visit for htn check      Relevant Medications   hydrochlorothiazide (HYDRODIURIL) 25 MG tablet   losartan (COZAAR) 50 MG tablet   Other Relevant Orders   BASIC METABOLIC PANEL WITH GFR     Digestive   Cold sore   Relevant Medications   valACYclovir (VALTREX) 1000 MG tablet     Other   Encounter for smoking cessation counseling    Return in about 2 weeks (around 08/11/2022).      Meds ordered this encounter  Medications   hydrochlorothiazide (HYDRODIURIL) 25 MG tablet    Sig: Take 1 tablet (25 mg  total) by mouth daily.    Dispense:  90 tablet    Refill:  1   losartan (COZAAR) 50 MG tablet    Sig: Take 1 tablet (50 mg total) by mouth daily.    Dispense:  90 tablet    Refill:  1   valACYclovir (VALTREX) 1000 MG tablet    Sig: TAKE 2 TABLETS BY MOUTH 2 TIMES DAILY. FOR ONE DAY, AS NEEDED AT FIRST SIGN OF COLD SORE OUTBREAK    Dispense:  60 tablet    Refill:  1    Orders Placed This Encounter  Procedures   BASIC METABOLIC PANEL WITH GFR     Charlton Amor, DO  [facility] at [facility] [phone] (phone) [phone] (fax)  [facility]

## 2022-07-28 NOTE — Assessment & Plan Note (Signed)
-   continue losartan and hydrochlorothiazide  - refills provided  - follow up in 2-4 weeks for nurse visit for htn check

## 2022-07-29 LAB — BASIC METABOLIC PANEL WITH GFR
BUN: 12 mg/dL (ref 7–25)
CO2: 28 mmol/L (ref 20–32)
Calcium: 10.3 mg/dL (ref 8.6–10.4)
Chloride: 103 mmol/L (ref 98–110)
Creat: 0.77 mg/dL (ref 0.50–1.05)
Glucose, Bld: 84 mg/dL (ref 65–99)
Potassium: 4.6 mmol/L (ref 3.5–5.3)
Sodium: 140 mmol/L (ref 135–146)
eGFR: 88 mL/min/{1.73_m2} (ref >=60)

## 2022-08-08 ENCOUNTER — Other Ambulatory Visit: Payer: Self-pay | Admitting: Family Medicine

## 2022-08-08 DIAGNOSIS — B001 Herpesviral vesicular dermatitis: Secondary | ICD-10-CM

## 2022-08-21 ENCOUNTER — Other Ambulatory Visit: Payer: Self-pay | Admitting: Family Medicine

## 2022-08-21 DIAGNOSIS — B001 Herpesviral vesicular dermatitis: Secondary | ICD-10-CM

## 2023-01-22 ENCOUNTER — Other Ambulatory Visit: Payer: Self-pay | Admitting: Family Medicine

## 2023-01-22 DIAGNOSIS — I10 Essential (primary) hypertension: Secondary | ICD-10-CM

## 2023-06-02 ENCOUNTER — Encounter: Payer: Self-pay | Admitting: Family Medicine
